# Patient Record
Sex: Male | Born: 1975 | Race: White | Hispanic: No | Smoking: Never smoker
Health system: Southern US, Community
[De-identification: ages and names within clinical notes are randomized; demographics above are authoritative.]

## PROBLEM LIST (undated history)

## (undated) DIAGNOSIS — G8929 Other chronic pain: Secondary | ICD-10-CM

## (undated) DIAGNOSIS — H539 Unspecified visual disturbance: Secondary | ICD-10-CM

## (undated) DIAGNOSIS — R519 Headache, unspecified: Secondary | ICD-10-CM

## (undated) DIAGNOSIS — R51 Headache: Secondary | ICD-10-CM

## (undated) HISTORY — DX: Headache, unspecified: R51.9

## (undated) HISTORY — DX: Headache: R51

## (undated) HISTORY — PX: TONSILLECTOMY: SUR1361

## (undated) HISTORY — DX: Other chronic pain: G89.29

## (undated) HISTORY — DX: Unspecified visual disturbance: H53.9

---

## 2013-11-05 ENCOUNTER — Emergency Department (HOSPITAL_COMMUNITY): Payer: Commercial Indemnity

## 2013-11-05 ENCOUNTER — Encounter (HOSPITAL_COMMUNITY): Payer: Self-pay | Admitting: Emergency Medicine

## 2013-11-05 ENCOUNTER — Emergency Department (HOSPITAL_COMMUNITY)
Admission: EM | Admit: 2013-11-05 | Discharge: 2013-11-05 | Disposition: A | Discharge status: Home / Self Care | Payer: Commercial Indemnity | Attending: Emergency Medicine | Admitting: Emergency Medicine

## 2013-11-05 DIAGNOSIS — Y92410 Unspecified street and highway as the place of occurrence of the external cause: Secondary | ICD-10-CM | POA: Insufficient documentation

## 2013-11-05 DIAGNOSIS — Y9389 Activity, other specified: Secondary | ICD-10-CM | POA: Diagnosis not present

## 2013-11-05 DIAGNOSIS — S51809A Unspecified open wound of unspecified forearm, initial encounter: Secondary | ICD-10-CM | POA: Diagnosis present

## 2013-11-05 DIAGNOSIS — Z23 Encounter for immunization: Secondary | ICD-10-CM | POA: Diagnosis not present

## 2013-11-05 DIAGNOSIS — IMO0002 Reserved for concepts with insufficient information to code with codable children: Secondary | ICD-10-CM | POA: Insufficient documentation

## 2013-11-05 DIAGNOSIS — S41111A Laceration without foreign body of right upper arm, initial encounter: Secondary | ICD-10-CM

## 2013-11-05 MED ORDER — TETANUS-DIPHTH-ACELL PERTUSSIS 5-2.5-18.5 LF-MCG/0.5 IM SUSP
0.5000 mL | Freq: Once | INTRAMUSCULAR | Status: AC
  Administered 2013-11-05: 0.5 mL via INTRAMUSCULAR
  Filled 2013-11-05: qty 0.5

## 2013-11-05 MED ORDER — HYDROCODONE-ACETAMINOPHEN 5-325 MG PO TABS: 1.0000 | ORAL_TABLET | ORAL | Status: DC | PRN

## 2013-11-05 MED ORDER — IBUPROFEN 800 MG PO TABS: 800.0000 mg | ORAL_TABLET | Freq: Three times a day (TID) | ORAL | Status: DC

## 2013-11-05 MED ORDER — CYCLOBENZAPRINE HCL 10 MG PO TABS: 10.0000 mg | ORAL_TABLET | Freq: Two times a day (BID) | ORAL | Status: DC | PRN
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
## 2013-11-05 NOTE — Discharge Instructions (Signed)
Tetanus and Diphtheria Vaccine Your caregiver has suggested that you receive an immunization to prevent tetanus (lockjaw) and diphtheria. Tetanus and diphtheria are serious and deadly infectious diseases of the past that have been nearly wiped out by modern immunizations. Td or DT vaccines (shots) are the immunizations given to help prevent these illnesses. Td is the medical term for a standard tetanus dose, small diphtheria dose. DT means both in standard doses. ABOUT THE DISEASES Tetanus (lockjaw) and diphtheria are serious diseases. Tetanus is caused by a germ that lives in the soil. It enters the body through a cut or wound, often caused by a nail or broken piece of glass. You cannot catch tetanus from another person. Diphtheria spreads when germs pass from an infected person to the nose or throat of others. Tetanus causes serious, painful spasms of all muscles. It can lead to:  "Locking" of the muscles of the jaw and throat, so the patient cannot open his or her mouth or swallow.  Damage to the heart muscle. Diphtheria causes a thick coating in the nose, throat, or airway. It can lead to:  Breathing problems.  Kidney problems.  Heart failure.  Paralysis.  Death. ABOUT THE VACCINES  A vaccine is a shot (immunization) that can help prevent a disease. Vaccines have helped lower the rates of getting certain diseases. If people stopped getting vaccinated, more people would develop illnesses. These vaccines can be used in three ways:  As catch-up for people who did not get all their doses when they were children.  As a booster dose every 10 years.  For protection against tetanus infection, after a wound. Benefits of the vaccines Vaccination is the best way to protect against tetanus and diphtheria. Because of vaccination, there are fewer cases of these diseases. Cases are rare in children because most get a routine vaccination with DTP (Diphtheria, Tetanus, and Pertussis), DTaP  (Diphtheria, Tetanus, and acellular Pertussis), or DT (Diphtheria and Tetanus) vaccines. There would be many more cases if we stopped vaccinating people. Tetanus kills about 1 in 5 people who are infected. WHEN SHOULD YOU GET TD VACCINE?  Td is made for people 62 years of age and older.  People who have not gotten at least 3 doses of any tetanus and diphtheria vaccine (DTP, DTaP or DT) during their lifetime should do so using Td. After a person gets the third dose, a Td dose is needed every 10 years all through life. This is because protection fades over time. Booster shots are needed every 10 years.  Other vaccines may be given at the same time as Td. You may not know today whether your immunizations are current. The vaccine given today is to protect you from your next cut or injury. It does not offer protection for the current injury. An immune globulin injection may be given, if protection is needed immediately. Check with your caregiver later regarding your immunization status. Tell your caregiver if the person getting the vaccine:  Has ever had a serious allergic reaction or other problem with Td, or any other tetanus and diphtheria vaccine (DTP, DTaP, or DT). People who have had a serious allergic reaction should not receive the vaccine.  Has epilepsy or another nervous system illness.  Has had Guillain Barre Syndrome (GBS) in the past.  Now has a moderate or severe illness.  Is pregnant.  If you are not sure, ask your caregiver. WHAT ARE THE RISKS FROM TD VACCINE?  As with any medicine, there are very small  risks that serious problems, even death, could occur after getting a vaccine. However, the risk of a serious side effect from the vaccine is almost zero. °· The risks from the vaccine are much smaller than the risks from the diseases, if people stopped getting vaccinated. Both diseases can cause serious health problems, which are prevented by the vaccine. °· Almost all people who get  Td have no problems from it. °Mild problems °If mild problems occur, they usually start within hours to a day or two after vaccination. They may last 1-2 days: °· Soreness, redness, or swelling where the shot was given. °· Headache or tiredness. °· Occasionally, a low grade fever. °These problems can be worse in adults who get Td vaccine very often. Non-aspirin medicines may be used to reduce soreness. °Severe problems °These problems happen very rarely: °· Serious allergic reaction (at most, occurs in 1 in 1 million vaccinated persons). This occurs almost immediately, and is treatable with medicines. Signs of a serious allergic reaction include: °· Difficulty breathing. °· Hoarseness or wheezing. °· Hives. °· Dizziness. °· Deep, aching pain and muscle wasting in upper arm(s). °Overall, the benefits to you and your family from these vaccines are far greater than the risk. °WHAT TO DO IF THERE IS A SERIOUS REACTION: °· Call a caregiver or get the person to a doctor or emergency room right away. °· Write down what happened, the date and time it happened, and tell your caregiver. °· Ask your caregiver to file a Vaccine Adverse Event Report form or call, toll-free: [phone] °If you want to learn more about this vaccine, ask your caregiver. She/he can give you the vaccine package insert or suggest other sources of information. Also, the National Vaccine Injury Compensation Program gives compensation (payment) for persons thought to be injured by vaccines. For details call, toll-free: (800) 338-2382. °Document Released: 05/13/2000 Document Revised: 08/08/2011 Document Reviewed: 04/02/2009 °[employer]® Patient Information ©2014 [employer]. °Sutured Wound Care °Sutures are stitches that can be used to close wounds. Wound care helps prevent pain and infection.  °HOME CARE INSTRUCTIONS  °· Rest and elevate the injured area until all the pain and swelling are gone. °· Only take over-the-counter or prescription medicines  for pain, discomfort, or fever as directed by your caregiver. °· After 48 hours, gently wash the area with mild soap and water once a day, or as directed. Rinse off the soap. Pat the area dry with a clean towel. Do not rub the wound. This may cause bleeding. °· Follow your caregiver's instructions for how often to change the bandage (dressing). Stop using a dressing after 2 days or after the wound stops draining. °· If the dressing sticks, moisten it with soapy water and gently remove it. °· Apply ointment on the wound as directed. °· Avoid stretching a sutured wound. °· Drink enough fluids to keep your urine clear or pale yellow. °· Follow up with your caregiver for suture removal as directed. °· Use sunscreen on your wound for the next 3 to 6 months so the scar will not darken. °SEEK IMMEDIATE MEDICAL CARE IF:  °· Your wound becomes red, swollen, hot, or tender. °· You have increasing pain in the wound. °· You have a red streak that extends from the wound. °· There is pus coming from the wound. °· You have a fever. °· You have shaking chills. °· There is a bad smell coming from the wound. °· You have persistent bleeding from the wound. °MAKE SURE YOU:  °·   Understand these instructions.  Will watch your condition.  Will get help right away if you are not doing well or get worse. Document Released: 06/23/2004 Document Revised: 08/08/2011 Document Reviewed: 09/19/2010 [facility] Patient Information 2014 [place], [place]. Cryotherapy Cryotherapy means treatment with cold. Ice or gel packs can be used to reduce both pain and swelling. Ice is the most helpful within the first 24 to 48 hours after an injury or flareup from overusing a muscle or joint. Sprains, strains, spasms, burning pain, shooting pain, and aches can all be eased with ice. Ice can also be used when recovering from surgery. Ice is effective, has very few side effects, and is safe for most people to use. PRECAUTIONS  Ice is not a safe treatment  option for people with:  Raynaud's phenomenon. This is a condition affecting small blood vessels in the extremities. Exposure to cold may cause your problems to return.  Cold hypersensitivity. There are many forms of cold hypersensitivity, including:  Cold urticaria. Red, itchy hives appear on the skin when the tissues begin to warm after being iced.  Cold erythema. This is a red, itchy rash caused by exposure to cold.  Cold hemoglobinuria. Red blood cells break down when the tissues begin to warm after being iced. The hemoglobin that carry oxygen are passed into the urine because they cannot combine with blood proteins fast enough.  Numbness or altered sensitivity in the area being iced. If you have any of the following conditions, do not use ice until you have discussed cryotherapy with your caregiver:  Heart conditions, such as arrhythmia, angina, or chronic heart disease.  High blood pressure.  Healing wounds or open skin in the area being iced.  Current infections.  Rheumatoid arthritis.  Poor circulation.  Diabetes. Ice slows the blood flow in the region it is applied. This is beneficial when trying to stop inflamed tissues from spreading irritating chemicals to surrounding tissues. However, if you expose your skin to cold temperatures for too long or without the proper protection, you can damage your skin or nerves. Watch for signs of skin damage due to cold. HOME CARE INSTRUCTIONS Follow these tips to use ice and cold packs safely.  Place a dry or damp towel between the ice and skin. A damp towel will cool the skin more quickly, so you may need to shorten the time that the ice is used.  For a more rapid response, add gentle compression to the ice.  Ice for no more than 10 to 20 minutes at a time. The bonier the area you are icing, the less time it will take to get the benefits of ice.  Check your skin after 5 minutes to make sure there are no signs of a poor response to  cold or skin damage.  Rest 20 minutes or more in between uses.  Once your skin is numb, you can end your treatment. You can test numbness by very lightly touching your skin. The touch should be so light that you do not see the skin dimple from the pressure of your fingertip. When using ice, most people will feel these normal sensations in this order: cold, burning, aching, and numbness.  Do not use ice on someone who cannot communicate their responses to pain, such as small children or people with dementia. HOW TO MAKE AN ICE PACK Ice packs are the most common way to use ice therapy. Other methods include ice massage, ice baths, and cryo-sprays. Muscle creams that cause a cold,  tingly feeling do not offer the same benefits that ice offers and should not be used as a substitute unless recommended by your caregiver. To make an ice pack, do one of the following:  Place crushed ice or a bag of frozen vegetables in a sealable plastic bag. Squeeze out the excess air. Place this bag inside another plastic bag. Slide the bag into a pillowcase or place a damp towel between your skin and the bag.  Mix 3 parts water with 1 part rubbing alcohol. Freeze the mixture in a sealable plastic bag. When you remove the mixture from the freezer, it will be slushy. Squeeze out the excess air. Place this bag inside another plastic bag. Slide the bag into a pillowcase or place a damp towel between your skin and the bag. SEEK MEDICAL CARE IF:  You develop white spots on your skin. This may give the skin a blotchy (mottled) appearance.  Your skin turns blue or pale.  Your skin becomes waxy or hard.  Your swelling gets worse. MAKE SURE YOU:   Understand these instructions.  Will watch your condition.  Will get help right away if you are not doing well or get worse. Document Released: 01/10/2011 Document Revised: 08/08/2011 Document Reviewed: 01/10/2011 [facility] Patient Information 2014 [employer] [place]. Motor  Vehicle Collision  It is common to have multiple bruises and sore muscles after a motor vehicle collision (MVC). These tend to feel worse for the first 24 hours. You may have the most stiffness and soreness over the first several hours. You may also feel worse when you wake up the first morning after your collision. After this point, you will usually begin to improve with each day. The speed of improvement often depends on the severity of the collision, the number of injuries, and the location and nature of these injuries. HOME CARE INSTRUCTIONS   Put ice on the injured area.  Put ice in a plastic bag.  Place a towel between your skin and the bag.  Leave the ice on for 15-20 minutes, 03-04 times a day.  Drink enough fluids to keep your urine clear or pale yellow. Do not drink alcohol.  Take a warm shower or bath once or twice a day. This will increase blood flow to sore muscles.  You may return to activities as directed by your caregiver. Be careful when lifting, as this may aggravate neck or back pain.  Only take over-the-counter or prescription medicines for pain, discomfort, or fever as directed by your caregiver. Do not use aspirin. This may increase bruising and bleeding. SEEK IMMEDIATE MEDICAL CARE IF:  You have numbness, tingling, or weakness in the arms or legs.  You develop severe headaches not relieved with medicine.  You have severe neck pain, especially tenderness in the middle of the back of your neck.  You have changes in bowel or bladder control.  There is increasing pain in any area of the body.  You have shortness of breath, lightheadedness, dizziness, or fainting.  You have chest pain.  You feel sick to your stomach (nauseous), throw up (vomit), or sweat.  You have increasing abdominal discomfort.  There is blood in your urine, stool, or vomit.  You have pain in your shoulder (shoulder strap areas).  You feel your symptoms are getting worse. MAKE SURE  YOU:   Understand these instructions.  Will watch your condition.  Will get help right away if you are not doing well or get worse. Document Released: 05/16/2005 Document Revised:  08/08/2011 Document Reviewed: 10/13/2010 [employer] Patient Information 2014 [place].

## 2013-11-06 NOTE — ED Provider Notes (Signed)
Medical screening examination/treatment/procedure(s) were performed by non-physician practitioner and as supervising physician I was immediately available for consultation/collaboration.   EKG Interpretation None        Brendi Mccarroll T Denea Cheaney, MD 11/06/13 1541 

## 2013-11-14 ENCOUNTER — Encounter: Payer: Self-pay | Admitting: Emergency Medicine

## 2013-11-14 ENCOUNTER — Emergency Department
Admission: EM | Admit: 2013-11-14 | Discharge: 2013-11-14 | Disposition: A | Discharge status: Home / Self Care | Payer: Managed Care, Other (non HMO) | Source: Home / Self Care

## 2013-11-14 NOTE — ED Notes (Signed)
Removed 4 sutures from right forearm healing well, tolerated well, no signs of infection, cleaned dressed with bacitracin and a bandaid.

## 2014-04-11 ENCOUNTER — Ambulatory Visit (INDEPENDENT_AMBULATORY_CARE_PROVIDER_SITE_OTHER): Payer: Managed Care, Other (non HMO) | Admitting: Family Medicine

## 2014-04-11 ENCOUNTER — Encounter: Payer: Self-pay | Admitting: Family Medicine

## 2014-04-11 VITALS — BP 130/76 | HR 63 | Ht 71.0 in | Wt 208.0 lb

## 2014-04-11 DIAGNOSIS — Z0000 Encounter for general adult medical examination without abnormal findings: Secondary | ICD-10-CM

## 2014-04-11 LAB — CBC
HCT: 45.2 % (ref 39.0–52.0)
Hemoglobin: 15 g/dL (ref 13.0–17.0)
MCH: 29.6 pg (ref 26.0–34.0)
MCHC: 33.2 g/dL (ref 30.0–36.0)
MCV: 89.2 fL (ref 78.0–100.0)
PLATELETS: 289 10*3/uL (ref 150–400)
RBC: 5.07 MIL/uL (ref 4.22–5.81)
RDW: 13.2 % (ref 11.5–15.5)
WBC: 5.5 10*3/uL (ref 4.0–10.5)

## 2014-04-11 LAB — COMPLETE METABOLIC PANEL WITH GFR
ALBUMIN: 4.5 g/dL (ref 3.5–5.2)
ALT: 39 U/L (ref 0–53)
AST: 36 U/L (ref 0–37)
Alkaline Phosphatase: 84 U/L (ref 39–117)
BUN: 22 mg/dL (ref 6–23)
CALCIUM: 9.5 mg/dL (ref 8.4–10.5)
CHLORIDE: 103 meq/L (ref 96–112)
CO2: 25 mEq/L (ref 19–32)
Creat: 1.13 mg/dL (ref 0.50–1.35)
GFR, Est African American: >89 mL/min
GFR, Est Non African American: 81 mL/min
Glucose, Bld: 86 mg/dL (ref 70–99)
POTASSIUM: 4.3 meq/L (ref 3.5–5.3)
Sodium: 141 mEq/L (ref 135–145)
Total Bilirubin: 0.6 mg/dL (ref 0.2–1.2)
Total Protein: 7.1 g/dL (ref 6.0–8.3)

## 2014-04-11 LAB — LIPID PANEL
CHOLESTEROL: 182 mg/dL (ref 0–200)
HDL: 46 mg/dL (ref >39)
LDL Cholesterol: 122 mg/dL — ABNORMAL HIGH (ref 0–99)
Total CHOL/HDL Ratio: 4 Ratio
Triglycerides: 70 mg/dL (ref <150)
VLDL: 14 mg/dL (ref 0–40)

## 2014-04-11 NOTE — Progress Notes (Signed)
CC: Brian Rasmussen is a 38 y.o. male is here for No chief complaint on file.   Subjective: HPI:  Colonoscopy: , will begin screening at age 40. Prostate: Discussed screening risks/beneifts with patient during today's visit, we'll begin screening at age 40  Influenza Vaccine: declined Pneumovax: no current indication Td/Tdap: UTD as of 2015 Zoster: (Start 40 yo)  Presents for complete physical exam with no acute complaints. He has a biometric form to be filled out for [facility]   Rare alcohol use no tobacco or recreational drug use  Review of Systems - General ROS: negative for - chills, fever, night sweats, weight gain or weight loss Ophthalmic ROS: negative for - decreased vision Psychological ROS: negative for - anxiety or depression ENT ROS: negative for - hearing change, nasal congestion, tinnitus or allergies Hematological and Lymphatic ROS: negative for - bleeding problems, bruising or swollen lymph nodes Breast ROS: negative Respiratory ROS: no cough, shortness of breath, or wheezing Cardiovascular ROS: no chest pain or dyspnea on exertion Gastrointestinal ROS: no abdominal pain, change in bowel habits, or black or bloody stools Genito-Urinary ROS: negative for - genital discharge, genital ulcers, incontinence or abnormal bleeding from genitals Musculoskeletal ROS: negative for - joint pain or muscle pain Neurological ROS: negative for - headaches or memory loss Dermatological ROS: negative for lumps, mole changes, rash and skin lesion changes  History reviewed. No pertinent past medical history.  Past Surgical History  Procedure Laterality Date  . Tonsillectomy     Family History  Problem Relation Age of Onset  . Hyperlipidemia    . Hypertension      History   Social History  . Marital Status: Single    Spouse Name: N/A    Number of Children: N/A  . Years of Education: N/A   Occupational History  . Not on file.   Social History Main Topics  . Smoking  status: Never Smoker   . Smokeless tobacco: Not on file  . Alcohol Use: Not on file  . Drug Use: 4.00 per week  . Sexual Activity:    Partners: Male   Other Topics Concern  . Not on file   Social History Narrative     Objective: BP 130/76 mmHg  Pulse 63  Ht 5\' 11"  (1.803 m)  Wt 208 lb (94.348 kg)  BMI 29.02 kg/m2  General: No Acute Distress HEENT: Atraumatic, normocephalic, conjunctivae normal without scleral icterus.  No nasal discharge, hearing grossly intact, TMs with good landmarks bilaterally with no middle ear abnormalities, posterior pharynx clear without oral lesions. Neck: Supple, trachea midline, no cervical nor supraclavicular adenopathy. Pulmonary: Clear to auscultation bilaterally without wheezing, rhonchi, nor rales. Cardiac: Regular rate and rhythm.  No murmurs, rubs, nor gallops. No peripheral edema.  2+ peripheral pulses bilaterally. Abdomen: Bowel sounds normal.  No masses.  Non-tender without rebound.  Negative Murphy's sign. MSK: Grossly intact, no signs of weakness.  Full strength throughout upper and lower extremities.  Full ROM in upper and lower extremities.  No midline spinal tenderness. Neuro: Gait unremarkable, CN II-XII grossly intact.  C5-C6 Reflex 2/4 Bilaterally, L4 Reflex 2/4 Bilaterally.  Cerebellar function intact. Skin: No rashes. Psych: Alert and oriented to person/place/time.  Thought process normal. No anxiety/depression.  Assessment & Plan: Brian Rasmussen was seen today for no specified reason.  Diagnoses and associated orders for this visit:  Annual physical exam - Lipid panel - COMPLETE METABOLIC PANEL WITH GFR - CBC    Healthy lifestyle interventions including but not limited to  regular exercise, a healthy low fat diet, moderation of salt intake, the dangers of tobacco/alcohol/recreational drug use, nutrition supplementation, and accident avoidance were discussed with the patient and a handout was provided for future reference.  Return  if symptoms worsen or fail to improve.

## 2014-04-14 ENCOUNTER — Encounter: Payer: Self-pay | Admitting: Family Medicine

## 2014-06-06 ENCOUNTER — Ambulatory Visit: Payer: Managed Care, Other (non HMO) | Admitting: Family Medicine

## 2014-06-10 ENCOUNTER — Encounter: Payer: Self-pay | Admitting: Family Medicine

## 2014-06-10 ENCOUNTER — Ambulatory Visit (INDEPENDENT_AMBULATORY_CARE_PROVIDER_SITE_OTHER): Payer: Managed Care, Other (non HMO) | Admitting: Family Medicine

## 2014-06-10 VITALS — BP 143/77 | HR 74 | Wt 213.0 lb

## 2014-06-10 DIAGNOSIS — B070 Plantar wart: Secondary | ICD-10-CM

## 2014-06-10 NOTE — Progress Notes (Signed)
CC: Brian Rasmussen is a 39 y.o. male is here for plantar wart? left foot   Subjective: HPI:  Pain and growth localized on the plantar surface of the left foot further localized laterally which has been present for the last 2-4 weeks on a daily basis. It was initially improved with taking some skin off of it earlier last week however has enlarged in return back to a moderate degree of pain. It is painless when nonweightbearing and only present when he is applying pressure to the sole of his foot. He's never had this before. No other interventions other than that described above. He denies skin changes elsewhere. He denies any joint or skin pain elsewhere. Denies fevers, chills, swollen lymph nodes   Review Of Systems Outlined In HPI  No past medical history on file.  Past Surgical History  Procedure Laterality Date  . Tonsillectomy     Family History  Problem Relation Age of Onset  . Hyperlipidemia    . Hypertension      History   Social History  . Marital Status: Married    Spouse Name: N/A    Number of Children: N/A  . Years of Education: N/A   Occupational History  . Not on file.   Social History Main Topics  . Smoking status: Never Smoker   . Smokeless tobacco: Not on file  . Alcohol Use: Not on file  . Drug Use: 4.00 per week  . Sexual Activity:    Partners: Male   Other Topics Concern  . Not on file   Social History Narrative     Objective: BP 143/77 mmHg  Pulse 74  Wt 213 lb (96.616 kg)  Vital signs reviewed. General: Alert and Oriented, No Acute Distress HEENT: Pupils equal, round, reactive to light. Conjunctivae clear.  External ears unremarkable.  Moist mucous membranes. Lungs: Clear and comfortable work of breathing, speaking in full sentences without accessory muscle use. Cardiac: Regular rate and rhythm.  Neuro: CN II-XII grossly intact, gait normal. Extremities: No peripheral edema.  Strong peripheral pulses.  Mental Status: No depression,  anxiety, nor agitation. Logical though process. Skin: Warm and dry. Plantar wart on the left lateral sole of the foot.  Assessment & Plan: Brian Rasmussen was seen today for plantar wart? left foot.  Diagnoses and associated orders for this visit:  Plantar wart, left foot    After cleaning the bottom of the foot with alcoholic scalpel was used to compare down the callus overlying the root of the plantar wart. He was offered cryotherapy versus cantharidin. He preferred cryotherapy. I asked him to return in 2 weeks if a blister and scab has not formed and then fall off on its own.  Return if symptoms worsen or fail to improve.   Cryotherapy Procedure Note  Pre-operative Diagnosis: Painful plantar wart  Post-operative Diagnosis: Same  Locations: Plantar aspect of left foot  Indications: Pain  Anesthesia: None  Procedure Details  History of allergy to iodine: no. Pacemaker? no.  Patient informed of risks (permanent scarring, infection, light or dark discoloration, bleeding, infection, weakness, numbness and recurrence of the lesion) and benefits of the procedure and verbal informed consent obtained.  The areas are treated with liquid nitrogen therapy, frozen until ice ball extended 2 mm beyond lesion, allowed to thaw, and treated again. The patient tolerated procedure well.  The patient was instructed on post-op care, warned that there may be blister formation, redness and pain. Recommend OTC analgesia as needed for pain.  Condition:  Stable  Complications: none.  Plan: 1. Instructed to keep the area dry and covered for 24-48h and clean thereafter. 2. Warning signs of infection were reviewed.   3. Recommended that the patient use OTC analgesics as needed for pain.  4. Return PRN

## 2014-11-03 ENCOUNTER — Ambulatory Visit (INDEPENDENT_AMBULATORY_CARE_PROVIDER_SITE_OTHER): Payer: Managed Care, Other (non HMO) | Admitting: Family Medicine

## 2014-11-03 ENCOUNTER — Encounter: Payer: Self-pay | Admitting: Family Medicine

## 2014-11-03 VITALS — BP 124/75 | HR 59 | Wt 204.0 lb

## 2014-11-03 DIAGNOSIS — B070 Plantar wart: Secondary | ICD-10-CM

## 2014-11-03 NOTE — Progress Notes (Signed)
CC: Brian Rasmussen is a 39 y.o. male is here for Plantar Warts   Subjective: HPI:  Left foot pain localized on the plantar surface of the foot, lateral midfoot region. Present for the last 1 or 2 weeks. Seems to be more noticeable on a daily basis currently moderate in severity but only present when weightbearing. Described as sharp, nonradiating. He believes a callous is forming over this discomfort. Denies any skin changes elsewhere. Denies any joint pain or pain in general elsewhere. No interventions as of yet. no fevers, chills,nor swollen lymph nodes.   Review Of Systems Outlined In HPI  No past medical history on file.  Past Surgical History  Procedure Laterality Date  . Tonsillectomy     Family History  Problem Relation Age of Onset  . Hyperlipidemia    . Hypertension      History   Social History  . Marital Status: Married    Spouse Name: N/A  . Number of Children: N/A  . Years of Education: N/A   Occupational History  . Not on file.   Social History Main Topics  . Smoking status: Never Smoker   . Smokeless tobacco: Not on file  . Alcohol Use: Not on file  . Drug Use: 4.00 per week  . Sexual Activity:    Partners: Male   Other Topics Concern  . Not on file   Social History Narrative     Objective: BP 124/75 mmHg  Pulse 59  Wt 204 lb (92.534 kg)  Vital signs reviewed. General: Alert and Oriented, No Acute Distress HEENT: Pupils equal, round, reactive to light. Conjunctivae clear.  External ears unremarkable.  Moist mucous membranes. Lungs: Clear and comfortable work of breathing, speaking in full sentences without accessory muscle use. Cardiac: Regular rate and rhythm.  Neuro: CN II-XII grossly intact, gait normal. Extremities: No peripheral edema.  Strong peripheral pulses.  Mental Status: No depression, anxiety, nor agitation. Logical though process. Skin: Warm and dry. Tender plantar wart on the lateral plantar midfoot on the  left   Assessment & Plan: Brian Rasmussen was seen today for plantar warts.  Diagnoses and all orders for this visit:  Plantar wart   Time was taken to shave the callus overlying the wart with a scalpel after cleaning this region with alcohol. 3 cycles of cryotherapy were applied freezing for enough time to allow a ice ball to extend 3 mm beyond the site of the wart. Enough time was allowed to fall the freeze ball between each cycle.  If for some reason this does not work I've asked him to call me and I will fit him in any day of the week that I'm in the office that also works for him.  I discussed with him that this is not routine so if whoever scheduling him questions this he noticed to direct them to this office note.  Return in about 4 weeks (around 12/01/2014).

## 2015-09-08 ENCOUNTER — Ambulatory Visit (INDEPENDENT_AMBULATORY_CARE_PROVIDER_SITE_OTHER): Payer: Managed Care, Other (non HMO) | Admitting: Family Medicine

## 2015-09-08 ENCOUNTER — Encounter: Payer: Self-pay | Admitting: Family Medicine

## 2015-09-08 VITALS — BP 143/91 | HR 70 | Temp 98.1°F | Wt 200.0 lb

## 2015-09-08 DIAGNOSIS — R0982 Postnasal drip: Secondary | ICD-10-CM

## 2015-09-08 MED ORDER — AMOXICILLIN-POT CLAVULANATE 500-125 MG PO TABS: ORAL_TABLET | ORAL | Status: AC

## 2015-09-08 NOTE — Progress Notes (Signed)
CC: Brian Rasmussen is a 40 y.o. male is here for Sinusitis   Subjective: HPI:  Postnasal drip, sore throat, nonproductive cough present for the last 8 days worsening on a daily basis and no benefit from Xyzal, Zyrtec, Flonase. Symptoms are worse in the evening and first thing in the morning. Denies fevers, chest pain, shortness of breath, wheezing or difficulty swallowing. No rash or headache   Review Of Systems Outlined In HPI  No past medical history on file.  Past Surgical History  Procedure Laterality Date  . Tonsillectomy     Family History  Problem Relation Age of Onset  . Hyperlipidemia    . Hypertension      Social History   Social History  . Marital Status: Married    Spouse Name: N/A  . Number of Children: N/A  . Years of Education: N/A   Occupational History  . Not on file.   Social History Main Topics  . Smoking status: Never Smoker   . Smokeless tobacco: Not on file  . Alcohol Use: Not on file  . Drug Use: 4.00 per week  . Sexual Activity:    Partners: Male   Other Topics Concern  . Not on file   Social History Narrative     Objective: BP 143/91 mmHg  Pulse 70  Temp(Src) 98.1 F (36.7 C) (Oral)  Wt 200 lb (90.719 kg)  General: Alert and Oriented, No Acute Distress HEENT: Pupils equal, round, reactive to light. Conjunctivae clear.  External ears unremarkable, canals clear with intact TMs with appropriate landmarks.  Middle ear appears open without effusion. Pink inferior turbinates.  Moist mucous membranes, pharynxWithout lesions however moderate inflammation, uvula is midline..  Neck supple without palpable lymphadenopathy nor abnormal masses. Lungs: Clear to auscultation bilaterally, no wheezing/ronchi/rales.  Comfortable work of breathing. Good air movement. Extremities: No peripheral edema.  Strong peripheral pulses.  Mental Status: No depression, anxiety, nor agitation. Skin: Warm and dry.  Assessment & Plan: Christiane HaJonathan was seen today for  sinusitis.  Diagnoses and all orders for this visit:  Postnasal drip -     amoxicillin-clavulanate (AUGMENTIN) 500-125 MG tablet; Take one by mouth every 8 hours for ten total days.   Start Augmentin for suspected sinusitis consider nasal saline washes.  Return if symptoms worsen or fail to improve.

## 2015-12-09 ENCOUNTER — Ambulatory Visit (INDEPENDENT_AMBULATORY_CARE_PROVIDER_SITE_OTHER): Payer: Managed Care, Other (non HMO)

## 2015-12-09 ENCOUNTER — Ambulatory Visit (INDEPENDENT_AMBULATORY_CARE_PROVIDER_SITE_OTHER): Payer: Managed Care, Other (non HMO) | Admitting: Family Medicine

## 2015-12-09 ENCOUNTER — Encounter: Payer: Self-pay | Admitting: Family Medicine

## 2015-12-09 VITALS — BP 137/80 | HR 70 | Wt 204.0 lb

## 2015-12-09 DIAGNOSIS — R1032 Left lower quadrant pain: Secondary | ICD-10-CM

## 2015-12-09 DIAGNOSIS — K5649 Other impaction of intestine: Secondary | ICD-10-CM

## 2015-12-09 MED ORDER — SENNOSIDES-DOCUSATE SODIUM 8.6-50 MG PO TABS: 2.0000 | ORAL_TABLET | Freq: Every day | ORAL | Status: DC

## 2015-12-09 NOTE — Progress Notes (Signed)
CC: Brian Rasmussen is a 40 y.o. male is here for Abdominal Cramping   Subjective: HPI:  Left lower quadrant pain present for the last 3 days. It comes and goes throughout the day. Feels like a stretching sensation. It is nonradiating and mild in severity. He is not certain if it gets better or worse after a bowel movement. He denies any constipation or diarrhea. He tells me it almost feels like it's just gas. No interventions as of yet. No new change in diet or medications. No interventions as of yet. Denies any fevers, chills, genitourinary complaints or decreased appetite. No nausea, he's never had this before   Review Of Systems Outlined In HPI  No past medical history on file.  Past Surgical History  Procedure Laterality Date  . Tonsillectomy     Family History  Problem Relation Age of Onset  . Hyperlipidemia    . Hypertension      Social History   Social History  . Marital Status: Married    Spouse Name: N/A  . Number of Children: N/A  . Years of Education: N/A   Occupational History  . Not on file.   Social History Main Topics  . Smoking status: Never Smoker   . Smokeless tobacco: Not on file  . Alcohol Use: Not on file  . Drug Use: 4.00 per week  . Sexual Activity:    Partners: Male   Other Topics Concern  . Not on file   Social History Narrative     Objective: BP 137/80 mmHg  Pulse 70  Wt 204 lb (92.534 kg)  General: Alert and Oriented, No Acute Distress HEENT: Pupils equal, round, reactive to light. Conjunctivae clear.  Moist mucous membranes Lungs: Clear to auscultation bilaterally, no wheezing/ronchi/rales.  Comfortable work of breathing. Good air movement. Cardiac: Regular rate and rhythm. Normal S1/S2.  No murmurs, rubs, nor gallops.   Abdomen: Normal bowel sounds, soft and non tender without palpable masses. No rebound tenderness or rigidity. Extremities: No peripheral edema.  Strong peripheral pulses.  Mental Status: No depression, anxiety,  nor agitation. Skin: Warm and dry.  Assessment & Plan: Brian Rasmussen was seen today for abdominal cramping.  Diagnoses and all orders for this visit:  LLQ pain -     DG Abd 2 Views  Other orders -     senna-docusate (SENOKOT S) 8.6-50 MG tablet; Take 2 tablets by mouth at bedtime.   X-ray was obtained showing a moderate amount of stool in the colon, no other abnormality to account for his abdominal pain. I like to try Senokot for 3 days and if no better we'll get CT scan and blood work.  Return if symptoms worsen or fail to improve.

## 2017-06-23 ENCOUNTER — Encounter: Payer: Self-pay | Admitting: Physician Assistant

## 2017-06-23 ENCOUNTER — Ambulatory Visit (INDEPENDENT_AMBULATORY_CARE_PROVIDER_SITE_OTHER): Payer: Managed Care, Other (non HMO) | Admitting: Physician Assistant

## 2017-06-23 VITALS — BP 135/79 | HR 66 | Ht 71.0 in | Wt 210.0 lb

## 2017-06-23 DIAGNOSIS — Z7689 Persons encountering health services in other specified circumstances: Secondary | ICD-10-CM

## 2017-06-23 DIAGNOSIS — Z130 Encounter for screening for diseases of the blood and blood-forming organs and certain disorders involving the immune mechanism: Secondary | ICD-10-CM | POA: Diagnosis not present

## 2017-06-23 DIAGNOSIS — R030 Elevated blood-pressure reading, without diagnosis of hypertension: Secondary | ICD-10-CM

## 2017-06-23 DIAGNOSIS — Z131 Encounter for screening for diabetes mellitus: Secondary | ICD-10-CM | POA: Diagnosis not present

## 2017-06-23 DIAGNOSIS — M7741 Metatarsalgia, right foot: Secondary | ICD-10-CM

## 2017-06-23 DIAGNOSIS — Z13220 Encounter for screening for lipoid disorders: Secondary | ICD-10-CM

## 2017-06-23 DIAGNOSIS — E663 Overweight: Secondary | ICD-10-CM | POA: Diagnosis not present

## 2017-06-23 DIAGNOSIS — Z0189 Encounter for other specified special examinations: Secondary | ICD-10-CM

## 2017-06-23 DIAGNOSIS — Z008 Encounter for other general examination: Secondary | ICD-10-CM

## 2017-06-23 DIAGNOSIS — M21611 Bunion of right foot: Secondary | ICD-10-CM | POA: Insufficient documentation

## 2017-06-23 LAB — LIPID PANEL W/REFLEX DIRECT LDL
Cholesterol: 193 mg/dL (ref <200)
HDL: 42 mg/dL (ref >40)
LDL Cholesterol (Calc): 131 mg/dL (calc) — ABNORMAL HIGH
NON-HDL CHOLESTEROL (CALC): 151 mg/dL — AB (ref <130)
TRIGLYCERIDES: 97 mg/dL (ref <150)
Total CHOL/HDL Ratio: 4.6 (calc) (ref <5.0)

## 2017-06-23 LAB — COMPREHENSIVE METABOLIC PANEL
AG RATIO: 1.8 (calc) (ref 1.0–2.5)
ALT: 33 U/L (ref 9–46)
AST: 36 U/L (ref 10–40)
Albumin: 4.6 g/dL (ref 3.6–5.1)
Alkaline phosphatase (APISO): 51 U/L (ref 40–115)
BUN: 18 mg/dL (ref 7–25)
CHLORIDE: 105 mmol/L (ref 98–110)
CO2: 25 mmol/L (ref 20–32)
Calcium: 9.5 mg/dL (ref 8.6–10.3)
Creat: 1.1 mg/dL (ref 0.60–1.35)
GLOBULIN: 2.5 g/dL (ref 1.9–3.7)
GLUCOSE: 96 mg/dL (ref 65–99)
Potassium: 4.2 mmol/L (ref 3.5–5.3)
Sodium: 140 mmol/L (ref 135–146)
Total Bilirubin: 0.6 mg/dL (ref 0.2–1.2)
Total Protein: 7.1 g/dL (ref 6.1–8.1)

## 2017-06-23 LAB — CBC
HCT: 41.5 % (ref 38.5–50.0)
HEMOGLOBIN: 14.6 g/dL (ref 13.2–17.1)
MCH: 31 pg (ref 27.0–33.0)
MCHC: 35.2 g/dL (ref 32.0–36.0)
MCV: 88.1 fL (ref 80.0–100.0)
MPV: 10.1 fL (ref 7.5–12.5)
Platelets: 261 10*3/uL (ref 140–400)
RBC: 4.71 10*6/uL (ref 4.20–5.80)
RDW: 12.3 % (ref 11.0–15.0)
WBC: 5 10*3/uL (ref 3.8–10.8)

## 2017-06-23 MED ORDER — MELOXICAM 15 MG PO TABS
15.0000 mg | ORAL_TABLET | Freq: Every day | ORAL | 0 refills | Status: DC
Start: 2017-06-23 — End: 2017-06-30

## 2017-06-23 MED ORDER — MELOXICAM 15 MG PO TABS: 15.0000 mg | ORAL_TABLET | Freq: Every day | ORAL | 0 refills | Status: DC

## 2017-06-23 NOTE — Patient Instructions (Addendum)
For your blood pressure: - Goal <130/80 - monitor and log blood pressures at home - check around the same time each day in a relaxed setting - Limit salt to <2000 mg/day - Follow DASH eating plan - limit alcohol to 2 standard drinks per day for men and 1 per day for women - avoid tobacco products - weight loss: 7% of current body weight - follow-up every 6 months for your blood pressure    Physical Activity Recommendations for modifying lipids and lowering blood pressure Engage in aerobic physical activity to reduce LDL-cholesterol, non-HDL-cholesterol, and blood pressure  Frequency: 3-4 sessions per week  Intensity: moderate to vigorous  Duration: 40 minutes on average   Physical Activity Recommendations for secondary prevention 1. Aerobic exercise  Frequency: 3-5 sessions per week  Intensity: 50-80% capacity  Duration: 20 - 60 minutes  Examples: walking, treadmill, cycling, rowing, stair climbing, and arm/leg ergometry  2. Resistance exercise  Frequency: 2-3 sessions per week  Intensity: 10-15 repetitions/set to moderate fatigue  Duration: 1-3 sets of 8-10 upper and lower body exercises  Examples: calisthenics, elastic bands, cuff/hand weights, dumbbels, free weights, wall pulleys, and weight machines  Heart-Healthy Lifestyle  Eating a diet rich in vegetables, fruits and whole grains: also includes low-fat dairy products, poultry, fish, legumes, and nuts; limit intake of sweets, sugar-sweetened beverages and red meats  Getting regular exercise  Maintaining a healthy weight  Not smoking or getting help quitting  Staying on top of your health; for some people, lifestyle changes alone may not be enough to prevent a heart attack or stroke. In these cases, taking a statin at the right dose will most likely be necessary  

## 2017-06-23 NOTE — Progress Notes (Signed)
HPI:                                                                Brian Rasmussen is a 42 y.o. male who [facility] today to establish care  Current concerns: biometrics for insurance, right foot pain  Patient reports pain in the second toe of his right foot, gradually worsening for approximately 3 months. Sometimes his entire foot hurts. He plays tennis regularly and reports he has tried custom orthotics in the past.    Depression screen PHQ 2/9 06/23/2017  Decreased Interest 0  Down, Depressed, Hopeless 0  PHQ - 2 Score 0    No flowsheet data found.    Past Medical History:  Diagnosis Date  . Chronic headaches    Past Surgical History:  Procedure Laterality Date  . TONSILLECTOMY     Social History   Tobacco Use  . Smoking status: Never Smoker  . Smokeless tobacco: Never Used  Substance Use Topics  . Alcohol use: Yes    Alcohol/week: 1.2 - 2.4 oz    Types: 2 - 4 Standard drinks or equivalent per week   family history includes Cancer in his paternal grandmother; Heart attack in his father; Hyperlipidemia in his unknown relative; Hypertension in his unknown relative.    ROS: negative except as noted in the HPI  Medications: Current Outpatient Medications  Medication Sig Dispense Refill  . Multiple Vitamins-Minerals (MULTIVITAMIN PO) Take by mouth.    . meloxicam (MOBIC) 15 MG tablet Take 1 tablet (15 mg total) by mouth daily. 30 tablet 0   No current facility-administered medications for this visit.    No Known Allergies     Objective:  BP 135/79   Pulse 66   Ht 5\' 11"  (1.803 m)   Wt 210 lb (95.3 kg)   BMI 29.29 kg/m  Gen:  alert, not ill-appearing, no distress, appropriate for age HEENT: head normocephalic without obvious abnormality, conjunctiva and cornea clear, trachea midline Pulm: Normal work of breathing, normal phonation, clear to auscultation bilaterally CV: Normal rate,  regular rhythm, s1 and s2 distinct, no murmurs, clicks or rubs  Neuro: alert and oriented x 3, no tremor MSK: bunion of the right first MTP, tenderness of the right second metatarsal head, no visible swelling, bruising or redness of the right foot; extremities atraumatic, normal gait and station Skin: intact, no rashes on exposed skin, no jaundice, no cyanosis Psych: well-groomed, cooperative, good eye contact, euthymic mood, affect mood-congruent, speech is articulate, and thought processes clear and goal-directed    No results found for this or any previous visit (from the past 72 hour(s)). No results found.    Assessment and Plan: 42 y.o. male with   1. Encounter to establish care - reviewed PMH, PSH, PFH, medications and allergies - reviewed health maintenance - declines influenza - negative PHQ2  2. Encounter for biometric screening - CBC - Comprehensive metabolic panel - Lipid Panel w/reflex Direct LDL  3. Overweight (BMI 25.0-29.9) Wt Readings from Last 3 Encounters:  06/23/17 210 lb (95.3 kg)  12/09/15 204 lb (92.5 kg)  09/08/15 200 lb (90.7 kg)  - counseled on weight loss through decreasing caloric intake and increasing aerobic exercise  4. Screening, lipid -  Lipid Panel w/reflex Direct LDL  5. Screening for diabetes mellitus - Comprehensive metabolic panel  6. Screening for blood disease - CBC  7. Metatarsalgia of right foot - avoid barefoot walking, wearing supportive shoes and custom orthotics daily, daily NSAID - follow-up with Sports for unloading - meloxicam (MOBIC) 15 MG tablet; Take 1 tablet (15 mg total) by mouth daily.  Dispense: 30 tablet; Refill: 0  8. Bunion of great toe of right foot   9. Elevated blood pressure reading BP Readings from Last 3 Encounters:  06/23/17 135/79  12/09/15 137/80  09/08/15 (!) 143/91  - active surveillance - counseled on therapeutic lifestyle changes   Patient education and anticipatory guidance  given Patient agrees with treatment plan Follow-up in 1 year for CPE or sooner as needed if symptoms worsen or fail to improve  Levonne Hubert PA-C

## 2017-06-26 ENCOUNTER — Encounter: Payer: Self-pay | Admitting: Physician Assistant

## 2017-06-26 DIAGNOSIS — E789 Disorder of lipoprotein metabolism, unspecified: Secondary | ICD-10-CM | POA: Insufficient documentation

## 2017-06-26 NOTE — Progress Notes (Signed)
Good afternoon Thayer OhmChris,  Your labs look good overall. Your "unhealthy" cholesterol (LDL) is still borderline elevated. Recommend low fat diet and increased aerobic exercise. We will continue to monitor it yearly.  Best, Vinetta Bergamoharley

## 2017-06-30 ENCOUNTER — Encounter: Payer: Self-pay | Admitting: Sports Medicine

## 2017-06-30 ENCOUNTER — Ambulatory Visit (INDEPENDENT_AMBULATORY_CARE_PROVIDER_SITE_OTHER): Payer: Managed Care, Other (non HMO) | Admitting: Sports Medicine

## 2017-06-30 DIAGNOSIS — M7741 Metatarsalgia, right foot: Secondary | ICD-10-CM | POA: Diagnosis not present

## 2017-06-30 NOTE — Progress Notes (Signed)
Subjective:    I'm seeing this patient as a consultation for: Gena Frayharley Cummings, PA-C  CC: Right foot pain  HPI: This is a pleasant 42 year old male, he works [employer], on his feet for 12 hours/day.  For many months he has had pain that he localizes on the plantar aspect of his right foot at the base of the second metatarsal head.  No trauma, moderate, persistent without radiation.  No paresthesias into the toes.  I reviewed the past medical history, family history, social history, surgical history, and allergies today and no changes were needed.  Please see the problem list section below in epic for further details.  Past Medical History: Past Medical History:  Diagnosis Date  . Chronic headaches    Past Surgical History: Past Surgical History:  Procedure Laterality Date  . TONSILLECTOMY     Social History: Social History   Socioeconomic History  . Marital status: Married    Spouse name: None  . Number of children: None  . Years of education: None  . Highest education level: None  Social Needs  . Financial resource strain: None  . Food insecurity - worry: None  . Food insecurity - inability: None  . Transportation needs - medical: None  . Transportation needs - non-medical: None  Occupational History  . None  Tobacco Use  . Smoking status: Never Smoker  . Smokeless tobacco: Never Used  Substance and Sexual Activity  . Alcohol use: Yes    Alcohol/week: 1.2 - 2.4 oz    Types: 2 - 4 Standard drinks or equivalent per week  . Drug use: No  . Sexual activity: Yes    Partners: Male  Other Topics Concern  . None  Social History Narrative  . None   Family History: Family History  Problem Relation Age of Onset  . Hyperlipidemia Unknown   . Hypertension Unknown   . Heart attack Father   . Cancer Paternal Grandmother    Allergies: No Known Allergies Medications: See med rec.  Review of Systems: No headache, visual changes, nausea, vomiting, diarrhea,  constipation, dizziness, abdominal pain, skin rash, fevers, chills, night sweats, weight loss, swollen lymph nodes, body aches, joint swelling, muscle aches, chest pain, shortness of breath, mood changes, visual or auditory hallucinations.   Objective:   General: Well Developed, well nourished, and in no acute distress.  Neuro:  Extra-ocular muscles intact, able to move all 4 extremities, sensation grossly intact.  Deep tendon reflexes tested were normal. Psych: Alert and oriented, mood congruent with affect. ENT:  Ears and nose appear unremarkable.  Hearing grossly normal. Neck: Unremarkable overall appearance, trachea midline.  No visible thyroid enlargement. Eyes: Conjunctivae and lids appear unremarkable.  Pupils equal and round. Skin: Warm and dry, no rashes noted.  Cardiovascular: Pulses palpable, no extremity edema. Right foot: No visible erythema or swelling. Range of motion is full in all directions. Strength is 5/5 in all directions. Moderate hallux valgus. Bilateral pes planus No abnormal callus noted. No pain over the navicular prominence, or base of fifth metatarsal. No tenderness to palpation of the calcaneal insertion of plantar fascia. No pain at the Achilles insertion. No pain over the calcaneal bursa. No pain of the retrocalcaneal bursa. There is tenderness over the plantar aspect of the second metatarsal head, the entire MTP feels somewhat enlarged. No hallux rigidus or limitus. No tenderness palpation over interphalangeal joints. No pain with compression of the metatarsal heads. Neurovascularly intact distally.  Impression and Recommendations:  This case required medical decision making of moderate complexity.  Metatarsalgia of right foot Patient will return for custom molded orthotics. He does have some swelling of the right second MTP likely due to either arthritis or an old trauma. If custom orthotics with a metatarsal pad do not improve his symptoms we will  consider injection of this joint. ___________________________________________ Ihor Austin. Benjamin Stain, M.D., ABFM., CAQSM. Primary Care and Sports Medicine [facility]  Adjunct Instructor of Family Medicine  [employer]

## 2017-06-30 NOTE — Assessment & Plan Note (Signed)
Patient will return for custom molded orthotics. He does have some swelling of the right second MTP likely due to either arthritis or an old trauma. If custom orthotics with a metatarsal pad do not improve his symptoms we will consider injection of this joint.

## 2017-07-04 ENCOUNTER — Encounter: Payer: Self-pay | Admitting: Sports Medicine

## 2017-07-04 ENCOUNTER — Ambulatory Visit (INDEPENDENT_AMBULATORY_CARE_PROVIDER_SITE_OTHER): Payer: Managed Care, Other (non HMO) | Admitting: Sports Medicine

## 2017-07-04 DIAGNOSIS — M7741 Metatarsalgia, right foot: Secondary | ICD-10-CM | POA: Diagnosis not present

## 2017-07-04 NOTE — Progress Notes (Signed)
    Patient was fitted for a : standard, cushioned, semi-rigid orthotic. The orthotic was heated and afterward the patient stood on the orthotic blank positioned on the orthotic stand. The patient was positioned in subtalar neutral position and 10 degrees of ankle dorsiflexion in a weight bearing stance. After completion of molding, a stable base was applied to the orthotic blank. The blank was ground to a stable position for weight bearing. Size: 14 Base: [facility] Additional Posting and Padding: right large MT pad. The patient ambulated these, and they were very comfortable.  I spent 40 minutes with this patient, greater than 50% was face-to-face time counseling regarding the below diagnosis.  ___________________________________________ Ihor Austinhomas J. Benjamin Stainhekkekandam, M.D., ABFM., CAQSM. Primary Care and Sports Medicine  [facility]  Adjunct Instructor of Family Medicine  [employer]

## 2017-07-04 NOTE — Assessment & Plan Note (Signed)
Orthotics as above with large right metatarsal pad. Return in 1 month.

## 2017-08-01 ENCOUNTER — Ambulatory Visit: Payer: Managed Care, Other (non HMO) | Admitting: Sports Medicine

## 2017-08-01 DIAGNOSIS — Z0189 Encounter for other specified special examinations: Secondary | ICD-10-CM

## 2020-08-16 LAB — HM DIABETES EYE EXAM

## 2020-09-07 ENCOUNTER — Ambulatory Visit (INDEPENDENT_AMBULATORY_CARE_PROVIDER_SITE_OTHER): Payer: Managed Care, Other (non HMO) | Admitting: Family Medicine

## 2020-09-07 ENCOUNTER — Other Ambulatory Visit: Payer: Self-pay

## 2020-09-07 ENCOUNTER — Encounter: Payer: Self-pay | Admitting: Family Medicine

## 2020-09-07 VITALS — BP 137/88 | HR 74 | Temp 98.2°F | Ht 68.5 in | Wt 206.5 lb

## 2020-09-07 DIAGNOSIS — G43109 Migraine with aura, not intractable, without status migrainosus: Secondary | ICD-10-CM

## 2020-09-07 NOTE — Assessment & Plan Note (Addendum)
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  Discussed CT of the head to evaluate for any structural abnormalities given new onset.  Has seen optometrist. Discussed keeping headache diary to see if there may be any triggers contributing.   He isn't having frequently enough that he wants to try medication for prevention at this time and doesn't last long enough to warrant abortive medications.

## 2020-09-07 NOTE — Patient Instructions (Signed)
Form - Headache Record  There are many types and causes of headaches. A headache record can help guide your treatment plan. Use this form to record the details. Bring this form with you to your follow-up visits.  Follow your health care provider's instructions on how to describe your headache. You may be asked to:  · Use a pain scale. This is a tool to rate the intensity of your headache using words or numbers.  · Describe what your headache feels like, such as dull, achy, throbbing, or sharp.  Headache record  Date: _______________ Time (from start to end): ____________________ Location of the headache: _________________________  · Intensity of the headache: ____________________ Description of the headache: ______________________________________________________________  · Hours of sleep the night before the headache: __________  · Food or drinks before the headache started: ______________________________________________________________________________________  · Events before the headache started: _______________________________________________________________________________________________  · Symptoms before the headache started: __________________________________________________________________________________________  · Symptoms during the headache: __________________________________________________________________________________________________  · Treatment: ________________________________________________________________________________________________________________  · Effect of treatment: _________________________________________________________________________________________________________  · Other comments: ___________________________________________________________________________________________________________  Date: _______________ Time (from start to end): ____________________ Location of the headache: _________________________  · Intensity of the headache: ____________________ Description of the  headache: ______________________________________________________________  · Hours of sleep the night before the headache: __________  · Food or drinks before the headache started: ______________________________________________________________________________________  · Events before the headache started: ____________________________________________________________________________________________  · Symptoms before the headache started: _________________________________________________________________________________________  · Symptoms during the headache: _______________________________________________________________________________________________  · Treatment: ________________________________________________________________________________________________________________  · Effect of treatment: _________________________________________________________________________________________________________  · Other comments: ___________________________________________________________________________________________________________  Date: _______________ Time (from start to end): ____________________ Location of the headache: _________________________  · Intensity of the headache: ____________________ Description of the headache: ______________________________________________________________  · Hours of sleep the night before the headache: __________  · Food or drinks before the headache started: ______________________________________________________________________________________  · Events before the headache started: ____________________________________________________________________________________________  · Symptoms before the headache started: _________________________________________________________________________________________  · Symptoms during the headache: _______________________________________________________________________________________________  · Treatment:  ________________________________________________________________________________________________________________  · Effect of treatment: _________________________________________________________________________________________________________  · Other comments: ___________________________________________________________________________________________________________  Date: _______________ Time (from start to end): ____________________ Location of the headache: _________________________  · Intensity of the headache: ____________________ Description of the headache: ______________________________________________________________  · Hours of sleep the night before the headache: _________  · Food or drinks before the headache started: ______________________________________________________________________________________  · Events before the headache started: ____________________________________________________________________________________________  · Symptoms before the headache started: _________________________________________________________________________________________  · Symptoms during the headache: _______________________________________________________________________________________________  · Treatment: ________________________________________________________________________________________________________________  · Effect of treatment: _________________________________________________________________________________________________________  · Other comments: ___________________________________________________________________________________________________________  Date: _______________ Time (from start to end): ____________________ Location of the headache: _________________________  · Intensity of the headache: ____________________ Description of the headache: ______________________________________________________________  · Hours of sleep the night before the headache: _________  · Food or drinks  before the headache started: ______________________________________________________________________________________  · Events before the headache started: ____________________________________________________________________________________________  · Symptoms before the headache started: _________________________________________________________________________________________  · Symptoms during the headache: _______________________________________________________________________________________________  · Treatment: ________________________________________________________________________________________________________________  · Effect of treatment: _________________________________________________________________________________________________________  · Other comments: ___________________________________________________________________________________________________________  This information is not intended to replace advice given to you by your health care provider. Make sure you discuss any questions you have with your health care provider.  Document Revised: 06/04/2018 Document Reviewed: 06/04/2018  Elsevier Patient Education © 2021 [employer]

## 2020-09-07 NOTE — Progress Notes (Addendum)
Brian Rasmussen - 45 y.o. male MRN [id number]  Date of birth: [birth year]  Subjective Chief Complaint  Patient presents with  . Establish Care    HPI Brian Rasmussen is a 45 y.o. male here today for initial visit.  He has been in pretty good health.  Reports that over the past few months he has had episodes of visual disturbances that include blurred vision and flashes of light.  He did see an optometrist and was told everything looked normal and his symptoms sounded like ocular migraines.  He does get headache that accompanies this occasionally.  His symptoms typically only last about 20 minutes and then resolve.  He has had about 8 episodes in the past few months.  His father has similar symptoms and was diagnosed with ocular migraines previously.    ROS:  A comprehensive ROS was completed and negative except as noted per HPI  No Known Allergies  Past Medical History:  Diagnosis Date  . Chronic headaches   . Visual disturbances     Past Surgical History:  Procedure Laterality Date  . TONSILLECTOMY      Social History   Socioeconomic History  . Marital status: Married    Spouse name: Not on file  . Number of children: Not on file  . Years of education: Not on file  . Highest education level: Not on file  Occupational History  . Occupation: Agricultural consultant  Tobacco Use  . Smoking status: Never Smoker  . Smokeless tobacco: Never Used  Vaping Use  . Vaping Use: Never used  Substance and Sexual Activity  . Alcohol use: Yes    Alcohol/week: 3.0 - 4.0 standard drinks    Types: 3 - 4 Standard drinks or equivalent per week  . Drug use: No  . Sexual activity: Yes    Partners: Male    Birth control/protection: Post-menopausal  Other Topics Concern  . Not on file  Social History Narrative  . Not on file   Social Determinants of Health   Financial Resource Strain: Not on file  Food Insecurity: Not on file  Transportation Needs: Not on file  Physical Activity: Not on  file  Stress: Not on file  Social Connections: Not on file    Family History  Problem Relation Age of Onset  . Hyperlipidemia Other   . Hypertension Other   . Heart attack Father   . Cancer Paternal Grandmother     Health Maintenance  Topic Date Due  . Hepatitis C Screening  Never done  . COLONOSCOPY (Pts 45-30yrs Insurance coverage will need to be confirmed)  Never done  . COVID-19 Vaccine (3 - Booster for Pfizer series) 03/05/2020  . INFLUENZA VACCINE  12/28/2020  . TETANUS/TDAP  11/06/2023  . HPV VACCINES  Aged Out     ----------------------------------------------------------------------------------------------------------------------------------------------------------------------------------------------------------------- Physical Exam BP 137/88 (BP Location: Left Arm, Patient Position: Sitting, Cuff Size: Normal)   Pulse 74   Temp 98.2 F (36.8 C)   Ht 5' 8.5" (1.74 m)   Wt 206 lb 8 oz (93.7 kg)   SpO2 100%   BMI 30.94 kg/m   Physical Exam Constitutional:      Appearance: Normal appearance.  HENT:     Head: Normocephalic and atraumatic.  Eyes:     Extraocular Movements: Extraocular movements intact.     Pupils: Pupils are equal, round, and reactive to light.  Cardiovascular:     Rate and Rhythm: Normal rate and regular rhythm.  Pulmonary:     Effort:  Pulmonary effort is normal.     Breath sounds: Normal breath sounds.  Musculoskeletal:     Cervical back: Neck supple.  Neurological:     General: No focal deficit present.     Mental Status: He is alert and oriented to person, place, and time. Mental status is at baseline.     Cranial Nerves: No cranial nerve deficit.     Motor: No weakness.  Psychiatric:        Mood and Affect: Mood normal.        Behavior: Behavior normal.      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  Discussed CT of the head to evaluate for any structural abnormalities given new onset.  Has seen optometrist. Discussed keeping headache diary to see if there may be any triggers contributing.   He isn't having frequently enough that he wants to try medication for prevention at this time and doesn't last long enough to warrant abortive medications.     No orders of the defined types were placed in this encounter.   No follow-ups on file.    This visit occurred during the SARS-CoV-2 public health emergency.  Safety protocols were in place, including screening questions prior to the visit, additional usage of staff PPE, and extensive cleaning of exam room while observing appropriate contact time as indicated for disinfecting solutions.

## 2020-09-09 ENCOUNTER — Encounter: Payer: Self-pay | Admitting: Family Medicine

## 2020-09-21 ENCOUNTER — Other Ambulatory Visit: Payer: Self-pay | Admitting: Family Medicine

## 2020-09-21 DIAGNOSIS — R42 Dizziness and giddiness: Secondary | ICD-10-CM

## 2020-09-21 DIAGNOSIS — G43109 Migraine with aura, not intractable, without status migrainosus: Secondary | ICD-10-CM

## 2020-09-21 NOTE — Addendum Note (Signed)
Addended by: Mammie Lorenzo on: 09/21/2020 04:58 PM   Modules accepted: Orders

## 2020-10-12 ENCOUNTER — Other Ambulatory Visit: Payer: Self-pay

## 2020-10-12 ENCOUNTER — Ambulatory Visit (INDEPENDENT_AMBULATORY_CARE_PROVIDER_SITE_OTHER): Payer: Managed Care, Other (non HMO) | Admitting: Family Medicine

## 2020-10-12 ENCOUNTER — Encounter: Payer: Self-pay | Admitting: Family Medicine

## 2020-10-12 VITALS — BP 144/93 | HR 64 | Ht 69.0 in | Wt 205.0 lb

## 2020-10-12 DIAGNOSIS — E789 Disorder of lipoprotein metabolism, unspecified: Secondary | ICD-10-CM | POA: Diagnosis not present

## 2020-10-12 DIAGNOSIS — R030 Elevated blood-pressure reading, without diagnosis of hypertension: Secondary | ICD-10-CM

## 2020-10-12 DIAGNOSIS — Z1211 Encounter for screening for malignant neoplasm of colon: Secondary | ICD-10-CM

## 2020-10-12 DIAGNOSIS — Z0000 Encounter for general adult medical examination without abnormal findings: Secondary | ICD-10-CM | POA: Insufficient documentation

## 2020-10-12 LAB — CBC WITH DIFFERENTIAL/PLATELET
MCH: 30.8 pg (ref 27.0–33.0)
Neutrophils Relative %: 65.8 %

## 2020-10-12 NOTE — Assessment & Plan Note (Signed)
Well adult Orders Placed This Encounter  Procedures  . COMPLETE METABOLIC PANEL WITH GFR  . CBC with Differential  . Lipid Panel w/reflex Direct LDL  . TSH  . Cologuard  Screening: Colon cancer screening discussed.  He elects to have cologuard-ordered.  Immunizations: UTD Anticipatory guidance/Risk factor reduction:  Recommendations per AVS.

## 2020-10-12 NOTE — Progress Notes (Signed)
Brian Rasmussen - 45 y.o. male MRN [id number]  Date of birth: 1976-03-07  Subjective Chief Complaint  Patient presents with  . Annual Exam    HPI Brian Rasmussen is a 45 y.o. male here today for annual exam.  He has been dealing with some visual disturbances which are likely ocular migraines.  He has MRI scheduled for next month.   He will have updated labs today.   He elects to have Cologuard for colon cancer screening.  No family history of colon cancer.   He does play tennis frequently for exercise and feels like his diet is pretty good.    Review of Systems  Constitutional: Negative for chills, fever, malaise/fatigue and weight loss.  HENT: Negative for congestion, ear pain and sore throat.   Eyes: Negative for blurred vision, double vision and pain.  Respiratory: Negative for cough and shortness of breath.   Cardiovascular: Negative for chest pain and palpitations.  Gastrointestinal: Negative for abdominal pain, blood in stool, constipation, heartburn and nausea.  Genitourinary: Negative for dysuria and urgency.  Musculoskeletal: Negative for joint pain and myalgias.  Neurological: Negative for dizziness and headaches.  Endo/Heme/Allergies: Does not bruise/bleed easily.  Psychiatric/Behavioral: Negative for depression. The patient is not nervous/anxious and does not have insomnia.     No Known Allergies  Past Medical History:  Diagnosis Date  . Chronic headaches   . Visual disturbances     Past Surgical History:  Procedure Laterality Date  . TONSILLECTOMY      Social History   Socioeconomic History  . Marital status: Married    Spouse name: Not on file  . Number of children: Not on file  . Years of education: Not on file  . Highest education level: Not on file  Occupational History  . Occupation: Agricultural consultant  Tobacco Use  . Smoking status: Never Smoker  . Smokeless tobacco: Never Used  Vaping Use  . Vaping Use: Never used  Substance and Sexual  Activity  . Alcohol use: Yes    Alcohol/week: 3.0 - 4.0 standard drinks    Types: 3 - 4 Standard drinks or equivalent per week  . Drug use: No  . Sexual activity: Yes    Partners: Male    Birth control/protection: Post-menopausal  Other Topics Concern  . Not on file  Social History Narrative  . Not on file   Social Determinants of Health   Financial Resource Strain: Not on file  Food Insecurity: Not on file  Transportation Needs: Not on file  Physical Activity: Not on file  Stress: Not on file  Social Connections: Not on file    Family History  Problem Relation Age of Onset  . Hyperlipidemia Other   . Hypertension Other   . Heart attack Father   . Cancer Paternal Grandmother     Health Maintenance  Topic Date Due  . Hepatitis C Screening  Never done  . COLONOSCOPY (Pts 45-72yrs Insurance coverage will need to be confirmed)  Never done  . COVID-19 Vaccine (3 - Booster for Pfizer series) 02/04/2020  . INFLUENZA VACCINE  12/28/2020  . TETANUS/TDAP  11/06/2023  . HPV VACCINES  Aged Out     ----------------------------------------------------------------------------------------------------------------------------------------------------------------------------------------------------------------- Physical Exam BP (!) 144/93 (BP Location: Right Arm, Patient Position: Sitting, Cuff Size: Normal)   Pulse 64   Ht 5\' 9"  (1.753 m)   Wt 205 lb (93 kg)   SpO2 99%   BMI 30.27 kg/m   Physical Exam Constitutional:  General: He is not in acute distress. HENT:     Head: Normocephalic and atraumatic.     Right Ear: External ear normal.     Left Ear: External ear normal.  Eyes:     General: No scleral icterus. Neck:     Thyroid: No thyromegaly.  Cardiovascular:     Rate and Rhythm: Normal rate and regular rhythm.     Heart sounds: Normal heart sounds.  Pulmonary:     Effort: Pulmonary effort is normal.     Breath sounds: Normal breath sounds.  Abdominal:      General: Bowel sounds are normal. There is no distension.     Palpations: Abdomen is soft.     Tenderness: There is no abdominal tenderness. There is no guarding.  Musculoskeletal:     Cervical back: Normal range of motion.  Lymphadenopathy:     Cervical: No cervical adenopathy.  Skin:    General: Skin is warm and dry.     Findings: No rash.  Neurological:     Mental Status: He is alert and oriented to person, place, and time.     Cranial Nerves: No cranial nerve deficit.     Motor: No abnormal muscle tone.  Psychiatric:        Mood and Affect: Mood normal.        Behavior: Behavior normal.     ------------------------------------------------------------------------------------------------------------------------------------------------------------------------------------------------------------------- Assessment and Plan  Well adult exam Well adult Orders Placed This Encounter  Procedures  . COMPLETE METABOLIC PANEL WITH GFR  . CBC with Differential  . Lipid Panel w/reflex Direct LDL  . TSH  . Cologuard  Screening: Colon cancer screening discussed.  He elects to have cologuard-ordered.  Immunizations: UTD Anticipatory guidance/Risk factor reduction:  Recommendations per AVS.    No orders of the defined types were placed in this encounter.   No follow-ups on file.    This visit occurred during the SARS-CoV-2 public health emergency.  Safety protocols were in place, including screening questions prior to the visit, additional usage of staff PPE, and extensive cleaning of exam room while observing appropriate contact time as indicated for disinfecting solutions.

## 2020-10-12 NOTE — Patient Instructions (Signed)

## 2020-10-13 LAB — LIPID PANEL W/REFLEX DIRECT LDL
Cholesterol: 210 mg/dL — ABNORMAL HIGH (ref <200)
HDL: 50 mg/dL (ref >=40)
LDL Cholesterol (Calc): 144 mg/dL (calc) — ABNORMAL HIGH
Non-HDL Cholesterol (Calc): 160 mg/dL (calc) — ABNORMAL HIGH (ref <130)
Total CHOL/HDL Ratio: 4.2 (calc) (ref <5.0)
Triglycerides: 65 mg/dL (ref <150)

## 2020-10-13 LAB — COMPLETE METABOLIC PANEL WITH GFR
AG Ratio: 1.9 (calc) (ref 1.0–2.5)
ALT: 19 U/L (ref 9–46)
AST: 21 U/L (ref 10–40)
Albumin: 4.7 g/dL (ref 3.6–5.1)
Alkaline phosphatase (APISO): 52 U/L (ref 36–130)
BUN: 18 mg/dL (ref 7–25)
CO2: 28 mmol/L (ref 20–32)
Calcium: 9.5 mg/dL (ref 8.6–10.3)
Chloride: 106 mmol/L (ref 98–110)
Creat: 1.01 mg/dL (ref 0.60–1.35)
GFR, Est African American: 103 mL/min/{1.73_m2} (ref >=60)
GFR, Est Non African American: 89 mL/min/{1.73_m2} (ref >=60)
Globulin: 2.5 g/dL (calc) (ref 1.9–3.7)
Glucose, Bld: 102 mg/dL — ABNORMAL HIGH (ref 65–99)
Potassium: 4.3 mmol/L (ref 3.5–5.3)
Sodium: 142 mmol/L (ref 135–146)
Total Bilirubin: 0.6 mg/dL (ref 0.2–1.2)
Total Protein: 7.2 g/dL (ref 6.1–8.1)

## 2020-10-13 LAB — CBC WITH DIFFERENTIAL/PLATELET
Absolute Monocytes: 484 cells/uL (ref 200–950)
Basophils Absolute: 22 cells/uL (ref 0–200)
Basophils Relative: 0.4 %
Eosinophils Absolute: 99 cells/uL (ref 15–500)
Eosinophils Relative: 1.8 %
HCT: 43.9 % (ref 38.5–50.0)
Hemoglobin: 15 g/dL (ref 13.2–17.1)
Lymphs Abs: 1276 cells/uL (ref 850–3900)
MCHC: 34.2 g/dL (ref 32.0–36.0)
MCV: 90.1 fL (ref 80.0–100.0)
MPV: 9.9 fL (ref 7.5–12.5)
Monocytes Relative: 8.8 %
Neutro Abs: 3619 cells/uL (ref 1500–7800)
Platelets: 242 10*3/uL (ref 140–400)
RBC: 4.87 10*6/uL (ref 4.20–5.80)
RDW: 12.6 % (ref 11.0–15.0)
Total Lymphocyte: 23.2 %
WBC: 5.5 10*3/uL (ref 3.8–10.8)

## 2020-10-13 LAB — TSH: TSH: 1.57 mIU/L (ref 0.40–4.50)

## 2020-11-02 ENCOUNTER — Ambulatory Visit (INDEPENDENT_AMBULATORY_CARE_PROVIDER_SITE_OTHER): Payer: Managed Care, Other (non HMO)

## 2020-11-02 ENCOUNTER — Other Ambulatory Visit: Payer: Self-pay

## 2020-11-02 DIAGNOSIS — G43109 Migraine with aura, not intractable, without status migrainosus: Secondary | ICD-10-CM

## 2020-11-02 DIAGNOSIS — R42 Dizziness and giddiness: Secondary | ICD-10-CM | POA: Diagnosis not present

## 2020-11-02 MED ORDER — GADOBUTROL 1 MMOL/ML IV SOLN
9.0000 mL | Freq: Once | INTRAVENOUS | Status: AC | PRN
  Administered 2020-11-02: 9 mL via INTRAVENOUS

## 2021-01-28 ENCOUNTER — Ambulatory Visit: Payer: Managed Care, Other (non HMO) | Admitting: Family Medicine

## 2021-01-28 ENCOUNTER — Telehealth: Payer: Self-pay | Admitting: Family Medicine

## 2021-01-28 NOTE — Telephone Encounter (Signed)
Patient missed appointment this morning due to being sick. Has rescheduled.

## 2021-02-09 ENCOUNTER — Encounter: Payer: Self-pay | Admitting: Family Medicine

## 2021-02-09 ENCOUNTER — Ambulatory Visit: Payer: Managed Care, Other (non HMO) | Admitting: Family Medicine

## 2021-02-09 ENCOUNTER — Other Ambulatory Visit: Payer: Self-pay

## 2021-02-09 VITALS — BP 133/84 | HR 76 | Temp 98.8°F | Ht 69.0 in | Wt 207.0 lb

## 2021-02-09 DIAGNOSIS — M549 Dorsalgia, unspecified: Secondary | ICD-10-CM

## 2021-02-09 DIAGNOSIS — G4730 Sleep apnea, unspecified: Secondary | ICD-10-CM | POA: Diagnosis not present

## 2021-02-09 DIAGNOSIS — R400 Somnolence: Secondary | ICD-10-CM | POA: Diagnosis not present

## 2021-02-09 NOTE — Progress Notes (Signed)
Brian Rasmussen - 45 y.o. male MRN [id number]  Date of birth: 11/18/1975  Subjective Chief Complaint  Patient presents with   Fatigue   Back Pain    HPI Brian Rasmussen is a 45 year old male here today with complaint of fatigue and upper back pain.  Reports some difficulty with sleep.  He wakes up a few times each night and has some difficulty falling back asleep.  Does not feel well rested in the morning.  He does feel some fatigue during the day.  Reports that when he gets home in the evenings he falls asleep shortly after sitting down.  He has had some snoring and some witnessed apnea at home.  Also complains of left-sided upper back pain.  This been ongoing for a few weeks.  Does not recall any injury.  There may be a component of overuse related to this due to tennis.  Pain does not radiate into the arms, he denies numbness or tingling.  ROS:  A comprehensive ROS was completed and negative except as noted per HPI  No Known Allergies  Past Medical History:  Diagnosis Date   Chronic headaches    Visual disturbances     Past Surgical History:  Procedure Laterality Date   TONSILLECTOMY      Social History   Socioeconomic History   Marital status: Significant Other    Spouse name: Not on file   Number of children: Not on file   Years of education: Not on file   Highest education level: Not on file  Occupational History   Occupation: Agricultural consultant  Tobacco Use   Smoking status: Never   Smokeless tobacco: Never  Vaping Use   Vaping Use: Never used  Substance and Sexual Activity   Alcohol use: Yes    Alcohol/week: 3.0 - 4.0 standard drinks    Types: 3 - 4 Standard drinks or equivalent per week   Drug use: No   Sexual activity: Yes    Partners: Male    Birth control/protection: Post-menopausal  Other Topics Concern   Not on file  Social History Narrative   Not on file   Social Determinants of Health   Financial Resource Strain: Not on file  Food Insecurity: Not on  file  Transportation Needs: Not on file  Physical Activity: Not on file  Stress: Not on file  Social Connections: Not on file    Family History  Problem Relation Age of Onset   Hyperlipidemia Other    Hypertension Other    Heart attack Father    Cancer Paternal Grandmother     Health Maintenance  Topic Date Due   Hepatitis C Screening  Never done   COVID-19 Vaccine (3 - Booster for Pfizer series) 02/25/2021 (Originally 02/04/2020)   INFLUENZA VACCINE  08/27/2021 (Originally 12/28/2020)   COLONOSCOPY (Pts 45-57yrs Insurance coverage will need to be confirmed)  02/09/2022 (Originally 02/06/2019)   TETANUS/TDAP  11/06/2023   Pneumococcal Vaccine 77-8 Years old  Aged Out   HPV VACCINES  Aged Out     ----------------------------------------------------------------------------------------------------------------------------------------------------------------------------------------------------------------- Physical Exam BP 133/84   Pulse 76   Temp 98.8 F (37.1 C)   Ht 5\' 9"  (1.753 m)   Wt 207 lb (93.9 kg)   SpO2 100%   BMI 30.57 kg/m   Physical Exam Constitutional:      Appearance: Normal appearance.  HENT:     Head: Normocephalic and atraumatic.  Eyes:     General: No scleral icterus. Cardiovascular:     Rate and  Rhythm: Normal rate and regular rhythm.  Musculoskeletal:     Cervical back: Neck supple.  Neurological:     General: No focal deficit present.     Mental Status: He is alert.  Psychiatric:        Mood and Affect: Mood normal.        Behavior: Behavior normal.    ------------------------------------------------------------------------------------------------------------------------------------------------------------------------------------------------------------------- Assessment and Plan  Daytime somnolence Based on current symptoms and observations at home I am concerned about the potential for sleep apnea.  Referral placed for sleep  medicine.  Upper back pain on left side Possibly related to muscle strain however I think this may be more radicular in nature.  Sending for trial of physical therapy initially.  If not improving with this we can consider imaging   No orders of the defined types were placed in this encounter.   No follow-ups on file.    This visit occurred during the SARS-CoV-2 public health emergency.  Safety protocols were in place, including screening questions prior to the visit, additional usage of staff PPE, and extensive cleaning of exam room while observing appropriate contact time as indicated for disinfecting solutions.

## 2021-02-09 NOTE — Assessment & Plan Note (Signed)
Possibly related to muscle strain however I think this may be more radicular in nature.  Sending for trial of physical therapy initially.  If not improving with this we can consider imaging

## 2021-02-09 NOTE — Assessment & Plan Note (Signed)
Based on current symptoms and observations at home I am concerned about the potential for sleep apnea.  Referral placed for sleep medicine.

## 2021-02-12 ENCOUNTER — Ambulatory Visit: Payer: Managed Care, Other (non HMO) | Admitting: Rehabilitative and Restorative Service Providers"

## 2021-02-15 ENCOUNTER — Other Ambulatory Visit: Payer: Self-pay

## 2021-02-15 ENCOUNTER — Encounter: Payer: Self-pay | Admitting: Rehabilitative and Restorative Service Providers"

## 2021-02-15 ENCOUNTER — Ambulatory Visit: Payer: Managed Care, Other (non HMO) | Admitting: Rehabilitative and Restorative Service Providers"

## 2021-02-15 DIAGNOSIS — R29898 Other symptoms and signs involving the musculoskeletal system: Secondary | ICD-10-CM

## 2021-02-15 DIAGNOSIS — M6281 Muscle weakness (generalized): Secondary | ICD-10-CM

## 2021-02-15 DIAGNOSIS — M545 Low back pain, unspecified: Secondary | ICD-10-CM | POA: Diagnosis not present

## 2021-02-15 DIAGNOSIS — M546 Pain in thoracic spine: Secondary | ICD-10-CM | POA: Diagnosis not present

## 2021-02-15 DIAGNOSIS — R293 Abnormal posture: Secondary | ICD-10-CM

## 2021-02-15 NOTE — Patient Instructions (Signed)
Access Code: [email or web address] URL: [email or web address] Date: 02/15/2021 Prepared by: Corlis Leak  Exercises Prone Press Up - 2 x daily - 7 x weekly - 1 sets - 10 reps - 2-3 sec hold Prone Press Up On Elbows - 2 x daily - 7 x weekly - 1 sets - 3 reps - 30 sec hold Supine Piriformis Stretch with Leg Straight - 2 x daily - 7 x weekly - 1 sets - 3 reps - 30 sec hold Hooklying Hamstring Stretch with Strap - 2 x daily - 7 x weekly - 1 sets - 3 reps - 30 sec hold Supine Transversus Abdominis Bracing with Pelvic Floor Contraction - 2 x daily - 7 x weekly - 1 sets - 10 reps - 10sec hold Doorway Pec Stretch at 60 Degrees Abduction - 3 x daily - 7 x weekly - 3 reps - 1 sets Doorway Pec Stretch at 90 Degrees Abduction - 3 x daily - 7 x weekly - 3 reps - 1 sets - 30 seconds hold Doorway Pec Stretch at 120 Degrees Abduction - 3 x daily - 7 x weekly - 3 reps - 1 sets - 30 second hold hold  Patient Education Posture and Body Mechanics Office Posture Trigger Point Dry Needling TENS Unit

## 2021-02-15 NOTE — Therapy (Signed)
[facility] [place] [address] [place], [place], [address] Phone: [phone]   Fax:  [phone]  Physical Therapy Evaluation  Patient Details  Name: Brian Rasmussen MRN: [id number] Date of Birth: 16-Jan-1976 Referring Provider (PT): Dr Everrett Coombe   Encounter Date: 02/15/2021   PT End of Session - 02/15/21 1712     Visit Number 1    Number of Visits 12    Date for PT Re-Evaluation 03/29/21    PT Start Time 1604    PT Stop Time 1700    PT Time Calculation (min) 56 min    Activity Tolerance Patient tolerated treatment well             Past Medical History:  Diagnosis Date   Chronic headaches    Visual disturbances     Past Surgical History:  Procedure Laterality Date   TONSILLECTOMY      There were no vitals filed for this visit.    Subjective Assessment - 02/15/21 1607     Subjective Patient reports that he has had painin the upper back for the past year with variable symptoms. He has been treated with chiropractic care with ~ 10 sessions with no change. He plays a lot of tennis and wonders if he irritates the upper back with tennis. He has had some pain in the center of LB 02/11/21 when bending over to pick up a tennis ball. now feels pain more on the Rt LB and ito the Rt buttocks into the anterior lateral thigh but not below the knee.    Pertinent History migraines    Patient Stated Goals get rid of the tightness and pain in the upper back    Currently in Pain? Yes    Pain Score 1     Pain Location Thoracic    Pain Orientation Left    Pain Descriptors / Indicators Nagging    Pain Type Chronic pain    Pain Radiating Towards pulling on neck; under arm along side of chest occasionally    Pain Onset More than a month ago    Pain Frequency Intermittent    Aggravating Factors  looking up leaning head back; tennis    Pain Relieving Factors time                [facility] PT Assessment - 02/15/21 0001        Assessment   Medical Diagnosis Thoracic pain; LBP    Referring Provider (PT) Dr Everrett Coombe    Onset Date/Surgical Date 09/28/19   LBP flare up 02/11/21   Hand Dominance Right    Next MD Visit as needed    Prior Therapy chiropractic care; massage therapy      Precautions   Precautions None      Restrictions   Weight Bearing Restrictions No      Balance Screen   Has the patient fallen in the past 6 months No    Has the patient had a decrease in activity level because of a fear of falling?  No    Is the patient reluctant to leave their home because of a fear of falling?  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%; standing 45%  Leisure tennis 4-6 x/wk ~ 2 hours- singles and doubles; gym free weights but not in the past 2 monts      Observation/Other Assessments   Focus on Therapeutic Outcomes (FOTO)  55      Sensation   Additional Comments WLF's per pt report      Posture/Postural Control   Posture Comments head forward; Lt shoulder girdle elevated compared to Rt; shoulders rounded and elevated; head of the humerus anterior in orientation; scapulae abducted and rotated along the thoracic wall - sits iwht rounded posture, decreased lumbar lordosis; LE's crossed      AROM   Cervical Flexion 62 pulling Lt upper back to mid shoulder blade area    Cervical Extension 32 pulling in Lt upper back to mid shoulder blades    Cervical - Right Side Bend 32    Cervical - Left Side Bend 40    Cervical - Right Rotation 65    Cervical - Left Rotation 64 tight    Lumbar Flexion 50% pain in LB    Lumbar Extension 60% no pain    Lumbar - Right Side Bend 65% pain in Rt LB    Lumbar - Left Side Bend 80% pulling in LB    Lumbar - Right Rotation 85%    Lumbar - Left Rotation 85%       Strength   Overall Strength Comments WFL's not tested resistively      Flexibility   Hamstrings Rt 55 deg; Lt 50 deg    Quadriceps WFL's bilat    ITB end range tightness    Piriformis tight Rt > Lt      Palpation   Spinal mobility hypomobility thoracic and lumbar spine with PA mobs    Palpation comment muscular tightness through ant/lat/post cervical musculature; pecs; upper trap; Rt QL/piriformis      Balance   Balance Assessed --   SLS ~ 10 sec each LE                       Objective measurements completed on examination: See above findings.       [facility] Adult PT Treatment/Exercise - 02/15/21 0001       Self-Care   Self-Care Other Self-Care Comments    Other Self-Care Comments  initiated back care education      Therapeutic Activites    Other Therapeutic Activities spinal alignment in sitting      Lumbar Exercises: Stretches   Passive Hamstring Stretch Right;Left;1 rep;30 seconds    Passive Hamstring Stretch Limitations supine with strap    Prone on Elbows Stretch 1 rep;30 seconds    Press Ups 5 reps;5 seconds    Piriformis Stretch Right;Left;1 rep;30 seconds      Lumbar Exercises: Supine   AB Set Limitations 4 part core 10 sec x 5 reps      Shoulder Exercises: ROM/Strengthening   Other ROM/Strengthening Exercises 3 position doorway 30 sec each x 1 rep each position                     PT Education - 02/15/21 1701     Education Details POC HEP posture; office ergonomics; DN; TENs    Person(s) Educated Patient    Methods Explanation;Demonstration;Tactile cues;Verbal cues;Handout    Comprehension Verbalized understanding;Returned demonstration;Verbal cues required;Tactile cues required                 PT Long Term Goals - 02/15/21 1723  PT LONG TERM GOAL #1   Title Decrease pain through upper to mid thoracic spine to improve functional and recreational activities    Time 6    Period Weeks    Status New     Target Date 03/29/21      PT LONG TERM GOAL #2   Title Increase cervical ROM in all planes to WNL's and pain free    Time 6    Period Weeks    Status New    Target Date 03/29/21      PT LONG TERM GOAL #3   Title Improve upper and lower core stability and strength allowing patient to play 1-2 hours of tennis without increase in symptoms    Time 6    Period Weeks    Status New    Target Date 03/29/21      PT LONG TERM GOAL #4   Title Independent in HEP (including aquatic therapy as indicated)    Time 6    Period Weeks    Status New    Target Date 03/29/21      PT LONG TERM GOAL #5   Title Improve functional limitation score to 76    Time 6    Period Weeks    Status New    Target Date 03/29/21                    Plan - 02/15/21 1712     Clinical Impression Statement Patient presents with upper back/thoracic back pain for > 1 year with variable intensity of symptoms. He reports a flare up of recurrent LBP 02/11/21 while bending over to pick up a tennis ball. Patient has poor posture and alignment; limited trunk and cervical mobility/ROM; muscular tightness to palpatioin; muscular imbalance; poor core strength/stability; pain with functional and recreational activities    Personal Factors and Comorbidities Past/Current Experience;Comorbidity 1;Time since onset of injury/illness/exacerbation    Comorbidities chronic pain; multiple body areas    Examination-Activity Limitations Lift;Bend    Examination-Participation Restrictions Other;Yard Work    Conservation officer, historic buildings Evolving/Moderate complexity    Clinical Decision Making Moderate    Rehab Potential Good    PT Frequency 2x / week    PT Duration 6 weeks    PT Treatment/Interventions ADLs/Self Care Home Management;Aquatic Therapy;Cryotherapy;Electrical Stimulation;Iontophoresis 4mg /ml Dexamethasone;Moist Heat;Ultrasound;Therapeutic activities;Therapeutic exercise;Neuromuscular re-education;Patient/family  education;Manual techniques;Dry needling;Taping    PT Next Visit Plan review HEP; add posterior shoulder girdle exercises; progress with core stabilization/strengthening; continue spine care education; trial of TENS unit for possible purchase for home use; specific stretches for tennis    PT Home Exercise Plan [email or web address]    Consulted and Agree with Plan of Care Patient             Patient will benefit from skilled therapeutic intervention in order to improve the following deficits and impairments:  Decreased range of motion, Increased fascial restricitons, Decreased activity tolerance, Pain, Hypomobility, Impaired flexibility, Improper body mechanics, Decreased mobility, Decreased strength, Postural dysfunction  Visit Diagnosis: Pain in thoracic spine  Acute bilateral low back pain without sciatica  Other symptoms and signs involving the musculoskeletal system  Abnormal posture  Muscle weakness (generalized)     Problem List Patient Active Problem List   Diagnosis Date Noted   Upper back pain on left side 02/09/2021   Daytime somnolence 02/09/2021   Well adult exam 10/12/2020   Ocular migraine 09/07/2020   Borderline high cholesterol 06/26/2017   Overweight (BMI 25.0-29.9) 06/23/2017  Metatarsalgia of right foot 06/23/2017   Bunion of great toe of right foot 06/23/2017   Elevated blood pressure reading 06/23/2017    Pearlean Sabina Rober Minion, PT, MPH  02/15/2021, 5:29 PM  [facility] [address] [place], [place], [address] Phone: [phone]   Fax:  [phone]  Name: Brian Rasmussen MRN: [id number] Date of Birth: 09-04-1975

## 2021-02-18 ENCOUNTER — Other Ambulatory Visit: Payer: Self-pay

## 2021-02-18 ENCOUNTER — Ambulatory Visit: Payer: Managed Care, Other (non HMO) | Admitting: Physical Therapy

## 2021-02-18 DIAGNOSIS — M546 Pain in thoracic spine: Secondary | ICD-10-CM

## 2021-02-18 DIAGNOSIS — R293 Abnormal posture: Secondary | ICD-10-CM | POA: Diagnosis not present

## 2021-02-18 DIAGNOSIS — R29898 Other symptoms and signs involving the musculoskeletal system: Secondary | ICD-10-CM | POA: Diagnosis not present

## 2021-02-18 DIAGNOSIS — M545 Low back pain, unspecified: Secondary | ICD-10-CM | POA: Diagnosis not present

## 2021-02-18 NOTE — Therapy (Signed)
[facility] [place] [address] [place], [place], [address] Phone: [phone]   Fax:  [phone]  Physical Therapy Treatment  Patient Details  Name: Brian Rasmussen MRN: [id number] Date of Birth: Jan 28, 1976 Referring Provider (PT): Dr Everrett Coombe   Encounter Date: 02/18/2021   PT End of Session - 02/18/21 1015     Visit Number 2    Number of Visits 12    Date for PT Re-Evaluation 03/29/21    PT Start Time 0932    PT Stop Time 1020    PT Time Calculation (min) 48 min    Activity Tolerance Patient tolerated treatment well    Behavior During Therapy [facility] for tasks assessed/performed             Past Medical History:  Diagnosis Date   Chronic headaches    Visual disturbances     Past Surgical History:  Procedure Laterality Date   TONSILLECTOMY      There were no vitals filed for this visit.   Subjective Assessment - 02/18/21 0933     Subjective Pt reports continued pain in Lt shoulder/thoracic area.  He complains of pain in low back and into Rt hip. Wonders if this is from the stretches.    Currently in Pain? Yes    Pain Score 2     Pain Location Thoracic    Pain Orientation Left    Pain Descriptors / Indicators Nagging    Aggravating Factors  looking up.    Pain Relieving Factors time    Multiple Pain Sites Yes    Pain Score 2    Pain Location Back    Pain Orientation Right    Pain Descriptors / Indicators Aching;Dull    Aggravating Factors  ?    Pain Relieving Factors ?                [facility] PT Assessment - 02/18/21 0001       Assessment   Medical Diagnosis Thoracic pain; LBP    Referring Provider (PT) Dr Everrett Coombe    Onset Date/Surgical Date 09/28/19   LBP flare up 02/11/21   Hand Dominance Right    Next MD Visit as needed    Prior Therapy chiropractic care; massage therapy              [facility] Adult PT Treatment/Exercise - 02/18/21 0001       Self-Care   Other Self-Care  Comments  Pt instructed in self massage with ball and roller stick; pt returned demo with cues.      Lumbar Exercises: Stretches   Passive Hamstring Stretch Right;Left;3 reps;20 seconds   supine x 2, seated x 1   Prone on Elbows Stretch 2 reps;30 seconds    Press Ups 2 reps;5 seconds    Piriformis Stretch Right;Left;2 reps;20 seconds      Lumbar Exercises: Seated   Other Seated Lumbar Exercises seated lap press x 3 sec x 2 reps      Lumbar Exercises: Supine   Ab Set 5 reps;5 seconds      Lumbar Exercises: Prone   Opposite Arm/Leg Raise Right arm/Left leg;Left arm/Right leg;5 reps;2 seconds      Shoulder Exercises: Stretch   Other Shoulder Stretches 3 position doorway stretch x 15 sec x 2 reps each position, straight arm pec stretch x 15 sec, overhead doorway stretch x 10 sec      Modalities   Modalities Moist Heat;Electrical Stimulation  Moist Heat Therapy   Number Minutes Moist Heat 10 Minutes    Moist Heat Location Shoulder;Cervical      Electrical Stimulation   Electrical Stimulation Location Lt levator/rhomboid area    Electrical Stimulation Action TENS    Electrical Stimulation Parameters intensity to tolerance, 10 min    Electrical Stimulation Goals Pain                          PT Long Term Goals - 02/15/21 1723       PT LONG TERM GOAL #1   Title Decrease pain through upper to mid thoracic spine to improve functional and recreational activities    Time 6    Period Weeks    Status New    Target Date 03/29/21      PT LONG TERM GOAL #2   Title Increase cervical ROM in all planes to WNL's and pain free    Time 6    Period Weeks    Status New    Target Date 03/29/21      PT LONG TERM GOAL #3   Title Improve upper and lower core stability and strength allowing patient to play 1-2 hours of tennis without increase in symptoms    Time 6    Period Weeks    Status New    Target Date 03/29/21      PT LONG TERM GOAL #4   Title Independent in  HEP (including aquatic therapy as indicated)    Time 6    Period Weeks    Status New    Target Date 03/29/21      PT LONG TERM GOAL #5   Title Improve functional limitation score to 76    Time 6    Period Weeks    Status New    Target Date 03/29/21                   Plan - 02/18/21 1243     Clinical Impression Statement Reviewed current HEP with minor cues for form.  Pt shown alternatives for hamstring and piriformis stretches.  All tolerated well.  Goals are ongoing.    Personal Factors and Comorbidities Past/Current Experience;Comorbidity 1;Time since onset of injury/illness/exacerbation    Comorbidities chronic pain; multiple body areas    Examination-Activity Limitations Lift;Bend    Examination-Participation Restrictions Other;Yard Work    Conservation officer, historic buildings Evolving/Moderate complexity    Rehab Potential Good    PT Frequency 2x / week    PT Duration 6 weeks    PT Treatment/Interventions ADLs/Self Care Home Management;Aquatic Therapy;Cryotherapy;Electrical Stimulation;Iontophoresis 4mg /ml Dexamethasone;Moist Heat;Ultrasound;Therapeutic activities;Therapeutic exercise;Neuromuscular re-education;Patient/family education;Manual techniques;Dry needling;Taping    PT Next Visit Plan add posterior shoulder girdle exercises; progress with core stabilization/strengthening; continue spine care education; specific stretches for tennis    PT Home Exercise Plan [email or web address]    Consulted and Agree with Plan of Care Patient             Patient will benefit from skilled therapeutic intervention in order to improve the following deficits and impairments:  Decreased range of motion, Increased fascial restricitons, Decreased activity tolerance, Pain, Hypomobility, Impaired flexibility, Improper body mechanics, Decreased mobility, Decreased strength, Postural dysfunction  Visit Diagnosis: Pain in thoracic spine  Acute bilateral low back pain without sciatica  Other  symptoms and signs involving the musculoskeletal system  Abnormal posture     Problem List Patient Active Problem List   Diagnosis Date Noted  Upper back pain on left side 02/09/2021   Daytime somnolence 02/09/2021   Well adult exam 10/12/2020   Ocular migraine 09/07/2020   Borderline high cholesterol 06/26/2017   Overweight (BMI 25.0-29.9) 06/23/2017   Metatarsalgia of right foot 06/23/2017   Bunion of great toe of right foot 06/23/2017   Elevated blood pressure reading 06/23/2017   Mayer Camel, PTA 02/18/21 12:47 PM  [facility] [place] [address] [place], [place], [address] Phone: [phone]   Fax:  [phone]  Name: Brian Rasmussen MRN: [id number] Date of Birth: 25-May-1976

## 2021-02-18 NOTE — Patient Instructions (Signed)

## 2021-02-22 ENCOUNTER — Encounter: Payer: Self-pay | Admitting: Rehabilitative and Restorative Service Providers"

## 2021-02-22 ENCOUNTER — Ambulatory Visit: Payer: Managed Care, Other (non HMO) | Admitting: Rehabilitative and Restorative Service Providers"

## 2021-02-22 ENCOUNTER — Other Ambulatory Visit: Payer: Self-pay

## 2021-02-22 DIAGNOSIS — M546 Pain in thoracic spine: Secondary | ICD-10-CM | POA: Diagnosis not present

## 2021-02-22 DIAGNOSIS — M6281 Muscle weakness (generalized): Secondary | ICD-10-CM

## 2021-02-22 DIAGNOSIS — R29898 Other symptoms and signs involving the musculoskeletal system: Secondary | ICD-10-CM | POA: Diagnosis not present

## 2021-02-22 DIAGNOSIS — R293 Abnormal posture: Secondary | ICD-10-CM

## 2021-02-22 DIAGNOSIS — M545 Low back pain, unspecified: Secondary | ICD-10-CM

## 2021-02-22 NOTE — Therapy (Signed)
[facility] [place] [address] [place], [place], [address] Phone: [phone]   Fax:  [phone]  Physical Therapy Treatment  Patient Details  Name: Brian Rasmussen MRN: [id number] Date of Birth: 07/04/1975 Referring Provider (PT): Dr Everrett Coombe   Encounter Date: 02/22/2021   PT End of Session - 02/22/21 1348     Visit Number 3    Number of Visits 12    Date for PT Re-Evaluation 03/29/21    PT Start Time 1345    PT Stop Time 1433    PT Time Calculation (min) 48 min    Activity Tolerance Patient tolerated treatment well             Past Medical History:  Diagnosis Date   Chronic headaches    Visual disturbances     Past Surgical History:  Procedure Laterality Date   TONSILLECTOMY      There were no vitals filed for this visit.   Subjective Assessment - 02/22/21 1348     Subjective Patient reports some improvement in the Lt shoulder and thoracic area. Played tennis Friday night 02/19/21 and did ok. Still a little bit of LBP but does not stop him from doing anything. LBP started from bending over to pick up a tennis ball 02/11/21.    Currently in Pain? Yes    Pain Score 2     Pain Location Thoracic    Pain Orientation Left    Pain Descriptors / Indicators Nagging    Pain Type Chronic pain                [facility] PT Assessment - 02/22/21 0001       Assessment   Medical Diagnosis Thoracic pain; LBP    Referring Provider (PT) Dr Everrett Coombe    Onset Date/Surgical Date 09/28/19   LBP flare up 02/11/21   Hand Dominance Right    Next MD Visit as needed    Prior Therapy chiropractic care; massage therapy      Palpation   Palpation comment muscular tightness through ant/lat/post cervical musculature; pecs; upper trap; Rt QL/piriformis                           [facility] Adult PT Treatment/Exercise - 02/22/21 0001       Lumbar Exercises: Stretches   Passive Hamstring Stretch  Right;Left;3 reps;20 seconds   supine x 2, seated x 1   Passive Hamstring Stretch Limitations supine with strap x 2 reps; sitting x 1rep each side    Piriformis Stretch Right;Left;2 reps;30 seconds    Other Lumbar Stretch Exercise supine trunk rotation with core engaged 20 sec x 3 reps each side      Lumbar Exercises: Standing   Scapular Retraction Strengthening;Both;10 reps;Theraband    Theraband Level (Scapular Retraction) Level 2 (Red)    Row Strengthening;Both;10 reps;Theraband    Theraband Level (Row) Level 4 (Blue)    Shoulder Extension Strengthening;Both;10 reps;Theraband    Theraband Level (Shoulder Extension) Level 4 (Blue)      Lumbar Exercises: Supine   AB Set Limitations 4 part core 10 sec x 5 reps      Lumbar Exercises: Prone   Opposite Arm/Leg Raise Right arm/Left leg;Left arm/Right leg;5 reps;2 seconds      Lumbar Exercises: Quadruped   Madcat/Old Horse 5 reps    Other Quadruped Lumbar Exercises childs pose 20 sec hold x 3 reps      Shoulder  Exercises: ROM/Strengthening   UBE (Upper Arm Bike) L3 x 2 min 1 min fwd/1 min back    X to V Arms V at wall x 10 reps    Other ROM/Strengthening Exercises T at wall 2 reps x 20 sec      Shoulder Exercises: Stretch   Other Shoulder Stretches 3 position doorway stretch x 30 sec x 2 reps each position, straight arm pec stretch x 30 sec, overhead doorway stretch x 15 sec      Manual Therapy   Manual therapy comments pt prone    Joint Mobilization lateral mobs thoracic spine Grade I/II    Soft tissue mobilization dep tissue work Lt > Rt cervical to thoracic paraspinals to Lt leveator scapulae    Myofascial Release thoracic paraspinals                     PT Education - 02/22/21 1406     Education Details HEP    Person(s) Educated Patient    Methods Explanation;Demonstration;Tactile cues;Verbal cues;Handout    Comprehension Verbalized understanding;Returned demonstration;Verbal cues required;Tactile cues required                  PT Long Term Goals - 02/15/21 1723       PT LONG TERM GOAL #1   Title Decrease pain through upper to mid thoracic spine to improve functional and recreational activities    Time 6    Period Weeks    Status New    Target Date 03/29/21      PT LONG TERM GOAL #2   Title Increase cervical ROM in all planes to WNL's and pain free    Time 6    Period Weeks    Status New    Target Date 03/29/21      PT LONG TERM GOAL #3   Title Improve upper and lower core stability and strength allowing patient to play 1-2 hours of tennis without increase in symptoms    Time 6    Period Weeks    Status New    Target Date 03/29/21      PT LONG TERM GOAL #4   Title Independent in HEP (including aquatic therapy as indicated)    Time 6    Period Weeks    Status New    Target Date 03/29/21      PT LONG TERM GOAL #5   Title Improve functional limitation score to 76    Time 6    Period Weeks    Status New    Target Date 03/29/21                   Plan - 02/22/21 1358     Clinical Impression Statement Continued discomfort and pain in the thoracic spine area on Lt. Added posterior shoulder girdle strengthening without difficulty. Palpable tightness in posterior cervical to thoracic area and into leveator on Lt. may benefit from trial of DN to this area.    Rehab Potential Good    PT Frequency 2x / week    PT Duration 6 weeks    PT Treatment/Interventions ADLs/Self Care Home Management;Aquatic Therapy;Cryotherapy;Electrical Stimulation;Iontophoresis 4mg /ml Dexamethasone;Moist Heat;Ultrasound;Therapeutic activities;Therapeutic exercise;Neuromuscular re-education;Patient/family education;Manual techniques;Dry needling;Taping    PT Next Visit Plan add posterior shoulder girdle exercises; progress with core stabilization/strengthening; continue spine care education; specific stretches for tennis    PT Home Exercise Plan [email or web address]    Consulted and Agree with Plan of Care  Patient  Patient will benefit from skilled therapeutic intervention in order to improve the following deficits and impairments:     Visit Diagnosis: Pain in thoracic spine  Acute bilateral low back pain without sciatica  Other symptoms and signs involving the musculoskeletal system  Abnormal posture  Muscle weakness (generalized)     Problem List Patient Active Problem List   Diagnosis Date Noted   Upper back pain on left side 02/09/2021   Daytime somnolence 02/09/2021   Well adult exam 10/12/2020   Ocular migraine 09/07/2020   Borderline high cholesterol 06/26/2017   Overweight (BMI 25.0-29.9) 06/23/2017   Metatarsalgia of right foot 06/23/2017   Bunion of great toe of right foot 06/23/2017   Elevated blood pressure reading 06/23/2017    Val Riles, PT MPH  02/22/2021, 2:34 PM  [facility] [address] [place], [place], [address] Phone: [phone]   Fax:  [phone]  Name: Brian Rasmussen MRN: [id number] Date of Birth: May 09, 1976

## 2021-02-22 NOTE — Patient Instructions (Addendum)
  Access Code: [email or web address] URL: [email or web address] Date: 02/22/2021 Prepared by: Corlis Leak  Exercises Prone Press Up - 2 x daily - 7 x weekly - 1 sets - 10 reps - 2-3 sec hold Prone Press Up On Elbows - 2 x daily - 7 x weekly - 1 sets - 3 reps - 30 sec hold Supine Piriformis Stretch with Leg Straight - 2 x daily - 7 x weekly - 1 sets - 3 reps - 30 sec hold Hooklying Hamstring Stretch with Strap - 2 x daily - 7 x weekly - 1 sets - 3 reps - 30 sec hold Supine Transversus Abdominis Bracing with Pelvic Floor Contraction - 2 x daily - 7 x weekly - 1 sets - 10 reps - 10sec hold Doorway Pec Stretch at 60 Degrees Abduction - 3 x daily - 7 x weekly - 3 reps - 1 sets Doorway Pec Stretch at 90 Degrees Abduction - 3 x daily - 7 x weekly - 3 reps - 1 sets - 30 seconds hold Doorway Pec Stretch at 120 Degrees Abduction - 3 x daily - 7 x weekly - 3 reps - 1 sets - 30 second hold hold Shoulder External Rotation and Scapular Retraction with Resistance - 2 x daily - 7 x weekly - 10 reps - 3 sets Scapular Retraction with Resistance - 2 x daily - 7 x weekly - 10 reps - 3 sets Scapular Retraction with Resistance Advanced - 2 x daily - 7 x weekly - 10 reps - 3 sets Cat Cow - 2 x daily - 7 x weekly - 1 sets - 5-10 reps - 3-5 sec hold Cat Cow to Child's Pose - 2 x daily - 7 x weekly - 1 sets - 3 reps - 30 sec hold

## 2021-02-23 ENCOUNTER — Encounter: Payer: Managed Care, Other (non HMO) | Admitting: Rehabilitative and Restorative Service Providers"

## 2021-02-26 ENCOUNTER — Encounter: Payer: Managed Care, Other (non HMO) | Admitting: Physical Therapy

## 2021-03-09 ENCOUNTER — Ambulatory Visit: Payer: Managed Care, Other (non HMO) | Admitting: Physical Therapy

## 2021-03-09 ENCOUNTER — Other Ambulatory Visit: Payer: Self-pay

## 2021-03-09 ENCOUNTER — Encounter: Payer: Self-pay | Admitting: Physical Therapy

## 2021-03-09 DIAGNOSIS — R293 Abnormal posture: Secondary | ICD-10-CM | POA: Diagnosis not present

## 2021-03-09 DIAGNOSIS — R29898 Other symptoms and signs involving the musculoskeletal system: Secondary | ICD-10-CM

## 2021-03-09 DIAGNOSIS — M6281 Muscle weakness (generalized): Secondary | ICD-10-CM | POA: Diagnosis not present

## 2021-03-09 DIAGNOSIS — M545 Low back pain, unspecified: Secondary | ICD-10-CM

## 2021-03-09 DIAGNOSIS — M546 Pain in thoracic spine: Secondary | ICD-10-CM | POA: Diagnosis not present

## 2021-03-09 NOTE — Therapy (Addendum)
[place] [place] [address] [place], [place], [address] Phone: [phone]   Fax:  [phone]  Physical Therapy Treatment and Discharge Summary  PHYSICAL THERAPY DISCHARGE SUMMARY  Visits from Start of Care: 4  Current functional level related to goals / functional outcomes: See progress note for discharge status    Remaining deficits: Continued intermittent symptoms but less intense/frequent   Education / Equipment: HEP    Patient agrees to discharge. Patient goals were partially met. Patient is being discharged due to not returning since the last visit.   Patient Details  Name: Brian Rasmussen MRN: [id number] Date of Birth: Feb 27, 1976 Referring Provider (PT): Dr Luetta Nutting   Encounter Date: 03/09/2021   PT End of Session - 03/09/21 0807     Visit Number 4    Number of Visits 12    Date for PT Re-Evaluation 03/29/21    PT Start Time 0802    PT Stop Time 0840    PT Time Calculation (min) 38 min    Activity Tolerance Patient tolerated treatment well    Behavior During Therapy [facility] for tasks assessed/performed             Past Medical History:  Diagnosis Date   Chronic headaches    Visual disturbances     Past Surgical History:  Procedure Laterality Date   TONSILLECTOMY      There were no vitals filed for this visit.   Subjective Assessment - 03/09/21 0807     Subjective Pt reports he may be over stretching with the prone press up as it irritated his back for a couple days.  He was able to play tennis without any pain.  "I just played through it".  He would like to address the tightness in his neck upper back.    Patient Stated Goals get rid of the tightness and pain in the upper back    Currently in Pain? No/denies   tightness.               [facility] PT Assessment - 03/09/21 0001       Assessment   Medical Diagnosis Thoracic pain; LBP    Referring Provider (PT) Dr Luetta Nutting     Onset Date/Surgical Date 09/28/19   LBP flare up 02/11/21   Hand Dominance Right    Next MD Visit as needed    Prior Therapy chiropractic care; massage therapy      AROM   Cervical Flexion 70    Cervical Extension 42    Cervical - Right Side Bend 38    Cervical - Left Side Bend 47    Cervical - Right Rotation 65    Cervical - Left Rotation 60    Lumbar Flexion able to touch toes                [facility] Adult PT Treatment/Exercise - 03/09/21 0001       Neck Exercises: Stretches   Upper Trapezius Stretch Right;Left;2 reps;10 seconds    Levator Stretch Right;Left;2 reps;10 seconds    Neck Stretch Limitations prone on elbows with cervical flexion to neutral and diagonals x 3 reps each .    Other Neck Stretches scalenes stretch with strap on top of shoulder x 10 sec each direction      Lumbar Exercises: Stretches   Prone on Elbows Stretch 1 rep;30 seconds    Press Ups 2 reps;5 reps      Lumbar Exercises: Quadruped   Madcat/Old  Horse 5 reps   and wag the tail.   Madcat/Old Horse Limitations cues to keep hips over knees, shoulders over hands    Other Quadruped Lumbar Exercises childs pose 10 sec hold      Shoulder Exercises: Standing   External Rotation Strengthening;Both;12 reps    Theraband Level (Shoulder External Rotation) Level 4 (Blue)    Extension Strengthening;12 reps    Theraband Level (Shoulder Extension) Level 4 (Blue)    Row Strengthening;Both;15 reps    Theraband Level (Shoulder Row) Level 4 (Blue)    Diagonals Strengthening;Both;12 reps    Theraband Level (Shoulder Diagonals) Level 4 (Blue)      Shoulder Exercises: ROM/Strengthening   UBE (Upper Arm Bike) L4: 1.5 min each direction      Shoulder Exercises: Stretch   Other Shoulder Stretches middle and high doorway stretch,      Manual Therapy   Soft tissue mobilization IASTM and STM to bilat traps, scalenes, rhomboids, cervical paraspinals to decrease fascial restrictions and improve mobility.                    PT Long Term Goals - 02/15/21 1723       PT LONG TERM GOAL #1   Title Decrease pain through upper to mid thoracic spine to improve functional and recreational activities    Time 6    Period Weeks    Status New    Target Date 03/29/21      PT LONG TERM GOAL #2   Title Increase cervical ROM in all planes to WNL's and pain free    Time 6    Period Weeks    Status New    Target Date 03/29/21      PT LONG TERM GOAL #3   Title Improve upper and lower core stability and strength allowing patient to play 1-2 hours of tennis without increase in symptoms    Time 6    Period Weeks    Status New    Target Date 03/29/21      PT LONG TERM GOAL #4   Title Independent in HEP (including aquatic therapy as indicated)    Time 6    Period Weeks    Status New    Target Date 03/29/21      PT LONG TERM GOAL #5   Title Improve functional limitation score to 76    Time 6    Period Weeks    Status New    Target Date 03/29/21                   Plan - 03/09/21 0844     Clinical Impression Statement Improved lumbar flexion/ hamstring flexibility; pt able to touch toes now with less pain.  Cervical ROM improved, however continue limitation with Lt lateral flexion and Rt rotation. Encouraged pt to stretch daily and complete band exercises every other day.  Progressing towards goals.    Rehab Potential Good    PT Frequency 2x / week    PT Duration 6 weeks    PT Treatment/Interventions ADLs/Self Care Home Management;Aquatic Therapy;Cryotherapy;Electrical Stimulation;Iontophoresis 29m/ml Dexamethasone;Moist Heat;Ultrasound;Therapeutic activities;Therapeutic exercise;Neuromuscular re-education;Patient/family education;Manual techniques;Dry needling;Taping    PT Next Visit Plan progress with core stabilization/strengthening; continue spine care education    PT Home Exercise Plan [id number]    Consulted and Agree with Plan of Care Patient             Patient will benefit  from skilled therapeutic intervention in order  to improve the following deficits and impairments:  Decreased range of motion, Increased fascial restricitons, Decreased activity tolerance, Pain, Hypomobility, Impaired flexibility, Improper body mechanics, Decreased mobility, Decreased strength, Postural dysfunction  Visit Diagnosis: Pain in thoracic spine  Acute bilateral low back pain without sciatica  Abnormal posture  Muscle weakness (generalized)  Other symptoms and signs involving the musculoskeletal system     Problem List Patient Active Problem List   Diagnosis Date Noted   Upper back pain on left side 02/09/2021   Daytime somnolence 02/09/2021   Well adult exam 10/12/2020   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 Helene Kelp PT, MPH 04/09/21 9:23 AM   [place], [place], [address] Phone: [phone]   Fax:  [phone]  Name: Brian Rasmussen MRN: [id number] Date of Birth: 04-18-1976

## 2021-03-12 ENCOUNTER — Encounter: Payer: Managed Care, Other (non HMO) | Admitting: Physical Therapy

## 2021-03-22 ENCOUNTER — Telehealth: Payer: Self-pay | Admitting: Neurology

## 2021-03-22 NOTE — Telephone Encounter (Signed)
Dr. Frances Furbish will be out this day. I rescheduled patient to 04/02/21 and left him a voice mail letting him know and asking him to call the office if this new appointment won't work for him.

## 2021-03-25 ENCOUNTER — Institutional Professional Consult (permissible substitution): Payer: Managed Care, Other (non HMO) | Admitting: Neurology

## 2021-04-02 ENCOUNTER — Institutional Professional Consult (permissible substitution): Payer: Self-pay | Admitting: Neurology

## 2021-04-29 ENCOUNTER — Institutional Professional Consult (permissible substitution): Payer: Self-pay | Admitting: Neurology

## 2021-05-05 ENCOUNTER — Encounter: Payer: Self-pay | Admitting: Neurology

## 2021-05-05 ENCOUNTER — Ambulatory Visit: Payer: Managed Care, Other (non HMO) | Admitting: Neurology

## 2021-05-05 VITALS — BP 151/80 | HR 64 | Ht 69.0 in | Wt 210.5 lb

## 2021-05-05 DIAGNOSIS — F518 Other sleep disorders not due to a substance or known physiological condition: Secondary | ICD-10-CM | POA: Diagnosis not present

## 2021-05-05 DIAGNOSIS — G4719 Other hypersomnia: Secondary | ICD-10-CM | POA: Insufficient documentation

## 2021-05-05 DIAGNOSIS — G43909 Migraine, unspecified, not intractable, without status migrainosus: Secondary | ICD-10-CM | POA: Diagnosis not present

## 2021-05-05 NOTE — Patient Instructions (Signed)
Hypersomnia °Hypersomnia is a condition in which a person feels very tired during the day even though he or she gets plenty of sleep at night. A person with this condition may take naps during the day and may find it very difficult to wake up from sleep. Hypersomnia may affect a person's ability to think, concentrate, drive, or remember things. °What are the causes? °The cause of this condition may not be known. Possible causes include: °Certain medicines. °Sleep disorders, such as narcolepsy and sleep apnea. °Injury to the head, brain, or spinal cord. °Drug or alcohol use. °Gastroesophageal reflux disease (GERD). °Tumors. °Certain medical conditions, such as depression, diabetes, or an underactive thyroid gland (hypothyroidism). °What are the signs or symptoms? °The main symptoms of hypersomnia include: °Feeling very tired throughout the day, regardless of how much sleep you got the night before. °Having trouble waking up. Others may find it difficult to wake you up when you are sleeping. °Sleeping for longer and longer periods at a time. °Taking naps throughout the day. °Other symptoms may include: °Feeling restless, anxious, or annoyed. °Lacking energy. °Having trouble with: °Remembering. °Speaking. °Thinking. °Loss of appetite. °Seeing, hearing, tasting, smelling, or feeling things that are not real (hallucinations). °How is this diagnosed? °This condition may be diagnosed based on: °Your symptoms and medical history. °Your sleeping habits. Your health care provider may ask you to write down your sleeping habits in a daily sleep log, along with any symptoms you have. °A series of tests that are done while you sleep (sleep study or polysomnogram). °A test that measures how quickly you can fall asleep during the day (daytime nap study or multiple sleep latency test). °How is this treated? °Treatment can help you manage your condition. Treatment may include: °Following a regular sleep routine. °Lifestyle changes,  such as changing your eating habits, getting regular exercise, and avoiding alcohol or caffeinated beverages. °Taking medicines to make you more alert (stimulants) during the day. °Treating any underlying medical causes of hypersomnia. °Follow these instructions at home: °Sleep routine ° °Schedule the same bedtime and wake-up time each day. °Practice a relaxing bedtime routine. This may include reading, meditation, deep breathing, or taking a warm bath before going to sleep. °Get regular exercise each day. Avoid strenuous exercise in the evening hours. °Keep your sleep environment at a cooler temperature, darkened, and quiet. °Sleep with pillows and a mattress that are comfortable and supportive. °Schedule short 20-minute naps for when you feel sleepiest during the day. °Talk with your employer or teachers about your hypersomnia. If possible, adjust your schedule so that: °You have a regular daytime work schedule. °You can take a scheduled nap during the day. °You do not have to work or be active at night. °Do not eat a heavy meal for a few hours before bedtime. Eat your meals at about the same times every day. °Avoid drinking alcohol or caffeinated beverages. °Safety ° °Do not drive or use heavy machinery if you are sleepy. Ask your health care provider if it is safe for you to drive. °Wear a life jacket when swimming or spending time near water. °General instructions °Take supplements and over-the-counter and prescription medicines only as told by your health care provider. °Keep a sleep log that will help your doctor manage your condition. This may include information about: °What time you go to bed each night. °How often you wake up at night. °How many hours you sleep at night. °How often and for how long you nap during the day. °  Any observations from others, such as leg movements during sleep, sleep walking, or snoring. °Keep all follow-up visits as told by your health care provider. This is important. °Contact  a health care provider if: °You have new symptoms. °Your symptoms get worse. °Get help right away if: °You have serious thoughts about hurting yourself or someone else. °If you ever feel like you may hurt yourself or others, or have thoughts about taking your own life, get help right away. You can go to your nearest emergency department or call: °Your local emergency services (911 in the [place] °A suicide crisis helpline, such as the National Suicide Prevention Lifeline at 1-800-273-8255 or 988 in the [place] This is open 24 hours a day. °Summary °Hypersomnia refers to a condition in which you feel very tired during the day even though you get plenty of sleep at night. °A person with this condition may take naps during the day and may find it very difficult to wake up from sleep. °Hypersomnia may affect a person's ability to think, concentrate, drive, or remember things. °Treatment, such as following a regular sleep routine and making some lifestyle changes, can help you manage your condition. °This information is not intended to replace advice given to you by your health care provider. Make sure you discuss any questions you have with your health care provider. °Document Revised: 12/09/2020 Document Reviewed: 03/26/2020 °Elsevier Patient Education © 2022 [employer] °Quality Sleep Information, Adult °Quality sleep is important for your mental and physical health. It also improves your quality of life. Quality sleep means you: °Are asleep for most of the time you are in bed. °Fall asleep within 30 minutes. °Wake up no more than once a night.  °Are awake for no longer than 20 minutes if you do wake up during the night. °Most adults need 7-8 hours of quality sleep each night. °How can poor sleep affect me? °If you do not get enough quality sleep, you may have: °Mood swings. °Daytime sleepiness. °Confusion. °Decreased reaction time. °Sleep disorders, such as insomnia and sleep apnea. °Difficulty with: °Solving  problems. °Coping with stress. °Paying attention. °These issues may affect your performance and productivity at work, school, and at home. Lack of sleep may also put you at higher risk for accidents, suicide, and risky behaviors. °If you do not get quality sleep you may also be at higher risk for several health problems, including: °Infections. °Type 2 diabetes. °Heart disease. °High blood pressure. °Obesity. °Worsening of long-term conditions, like arthritis, kidney disease, depression, Parkinson's disease, and epilepsy. °What actions can I take to get more quality sleep? °  °Stick to a sleep schedule. Go to sleep and wake up at about the same time each day. Do not try to sleep less on weekdays and make up for lost sleep on weekends. This does not work. °Try to get about 30 minutes of exercise on most days. Do not exercise 2-3 hours before going to bed. °Limit naps during the day to 30 minutes or less. °Do not use any products that contain nicotine or tobacco, such as cigarettes or e-cigarettes. If you need help quitting, ask your health care provider. °Do not drink caffeinated beverages for at least 8 hours before going to bed. Coffee, tea, and some sodas contain caffeine. °Do not drink alcohol close to bedtime. °Do not eat large meals close to bedtime. °Do not take naps in the late afternoon. °Try to get at least 30 minutes of sunlight every day. Morning sunlight is best. °Make   time to relax before bed. Reading, listening to music, or taking a hot bath promotes quality sleep. °Make your bedroom a place that promotes quality sleep. Keep your bedroom dark, quiet, and at a comfortable room temperature. Make sure your bed is comfortable. Take out sleep distractions like TV, a computer, smartphone, and bright lights. °If you are lying awake in bed for longer than 20 minutes, get up and do a relaxing activity until you feel sleepy. °Work with your health care provider to treat medical conditions that may affect  sleeping, such as: °Nasal obstruction. °Snoring. °Sleep apnea and other sleep disorders. °Talk to your health care provider if you think any of your prescription medicines may cause you to have difficulty falling or staying asleep. °If you have sleep problems, talk with a sleep consultant. If you think you have a sleep disorder, talk with your health care provider about getting evaluated by a specialist. °Where to find more information °National Sleep Foundation website: https://sleepfoundation.org °National Heart, Lung, and Blood Institute (NHLBI): www.nhlbi.nih.gov/files/docs/public/sleep/healthy_sleep.pdf °Centers for Disease Control and Prevention (CDC): www.cdc.gov/sleep/index.html °Contact a health care provider if you: °Have trouble getting to sleep or staying asleep. °Often wake up very early in the morning and cannot get back to sleep. °Have daytime sleepiness. °Have daytime sleep attacks of suddenly falling asleep and sudden muscle weakness (narcolepsy). °Have a tingling sensation in your legs with a strong urge to move your legs (restless legs syndrome). °Stop breathing briefly during sleep (sleep apnea). °Think you have a sleep disorder or are taking a medicine that is affecting your quality of sleep. °Summary °Most adults need 7-8 hours of quality sleep each night. °Getting enough quality sleep is an important part of health and well-being. °Make your bedroom a place that promotes quality sleep and avoid things that may cause you to have poor sleep, such as alcohol, caffeine, smoking, and large meals. °Talk to your health care provider if you have trouble falling asleep or staying asleep. °This information is not intended to replace advice given to you by your health care provider. Make sure you discuss any questions you have with your health care provider. °Document Revised: 08/23/2017 Document Reviewed: 08/23/2017 °Elsevier Patient Education © 2022 [employer] ° °

## 2021-05-05 NOTE — Progress Notes (Signed)
SLEEP MEDICINE CLINIC    Provider:  Larey Seat, MD  Primary Care Physician:  Luetta Nutting, [address] [place] [address]     Referring Provider: Luetta Nutting, [address] [place],  [place] [address]          Chief Complaint according to patient   Patient presents with:     New Patient (Initial Visit)           HISTORY OF PRESENT ILLNESS:  Brian Rasmussen is a 45 y.o. Caucasian male patient and seen in Consultation  on 05/05/2021 from PCP for excessive daytime sleepiness. Chief concern according to patient :  " I have never been a great sleeper, and I fall asleep in a movie theater, and yet, when I want to nap, I may not be able to ". 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 Photophobia is present. I also sit in front of a computer, screen light can bother me". I have vivid dreams now. I dream all night. I feel as if I am not sleeping  yet I have slept ".    Marcin Holte  has a past medical history of Chronic headaches and Visual disturbances.      Sleep relevant medical history:  Parasomnia , Tonsillectomy, allergic hay fever, ear tubes, sinusitis.    Family medical /sleep history:  PGM had insomnia, father has OSA but is not using CPAP, sister has Insomnia.  Social history:  Patient is working as a Management consultant- and lives in a household with [place],  adult daughter from a former marriage.  The patient currently works days.  One dog is present. Tobacco use none .  ETOH use; 3 a week-, Caffeine intake in form of Coffee( 1 a day) Soda( 1 a day) Tea ( hot tea at night-) or energy drinks. Regular exercise in form of Tennis..   Hobbies :Tennis      Sleep habits are as follows: The patient's dinner time is between 7-8 PM. The patient goes to bed at 12 PM and leaves the TV on a timer- he continues to sleep for 6 hours, wakes for one bathroom breaks, the first time at 5-6 AM.   The  preferred sleep position is sideways, with the support of 1-2 pillows. Dreams are reportedly frequent/vivid. Some dreams about fighting, defending, wrestling.  6  AM is the usual rise time. The patient wakes up with an alarm. He struggles to get up.  He reports not feeling refreshed or restored in AM, with symptoms such as dry mouth, rare morning headaches, and residual fatigue.  Naps are taken infrequently, usually while watching TV,  lasting from 20 to 30 minutes and are less refreshing than nocturnal sleep.    Review of Systems: Out of a complete 14 system review, the patient complains of only the following symptoms, and all other reviewed systems are negative.:  Fatigue, sleepiness ,  no snoring,    How likely are you to doze in the following situations: 0 = not likely, 1 = slight chance, 2 = moderate chance, 3 = high chance   Sitting and Reading? Watching Television? Sitting inactive in a public place (theater or meeting)? Theater yes, meeting , no  As a passenger in a car for an hour without a break? Lying down in the afternoon when circumstances permit? Sitting and talking to someone? Sitting quietly after lunch without alcohol? In  a car, while stopped for a few minutes in traffic?   Total = 15/ 24 points   FSS endorsed at n/a -/ 63 points.   Social History   Socioeconomic History   Marital status: Significant Other    Spouse name: Not on file   Number of children: 1   Years of education: 15   Highest education level: Not on file  Occupational History   Occupation: Chartered certified accountant  Tobacco Use   Smoking status: Never   Smokeless tobacco: Never  Vaping Use   Vaping Use: Never used  Substance and Sexual Activity   Alcohol use: Yes    Alcohol/week: 3.0 - 4.0 standard drinks    Types: 3 - 4 Standard drinks or equivalent per week    Comment: few drinks per week   Drug use: No   Sexual activity: Yes    Partners: Male    Birth control/protection: Post-menopausal   Other Topics Concern   Not on file  Social History Narrative   Right handed   Caffeine use: 28 oz coffee per day, coke sometimes    Social Determinants of Radio broadcast assistant Strain: Not on file  Food Insecurity: Not on file  Transportation Needs: Not on file  Physical Activity: Not on file  Stress: Not on file  Social Connections: Not on file    Family History  Problem Relation Age of Onset   Hyperlipidemia Other    Hypertension Other    Heart attack Father    Cancer Paternal Grandmother     Past Medical History:  Diagnosis Date   Chronic headaches    Visual disturbances     Past Surgical History:  Procedure Laterality Date   TONSILLECTOMY       Current Outpatient Medications on File Prior to Visit  Medication Sig Dispense Refill   Cetirizine HCl (ZYRTEC PO) Take by mouth.     ibuprofen (ADVIL) 200 MG tablet Take 200 mg by mouth every 6 (six) hours as needed.     Multiple Vitamins-Minerals (MULTIVITAMIN PO) Take by mouth.     No current facility-administered medications on file prior to visit.    No Known Allergies  Physical exam:  Today's Vitals   05/05/21 0848  BP: (!) 151/80  Pulse: 64  SpO2: 98%  Weight: 210 lb 8 oz (95.5 kg)  Height: _0  (1.753 m)   Body mass index is 31.09 kg/m.   Wt Readings from Last 3 Encounters:  05/05/21 210 lb 8 oz (95.5 kg)  02/09/21 207 lb (93.9 kg)  10/12/20 205 lb (93 kg)     Ht Readings from Last 3 Encounters:  05/05/21 _1  (1.753 m)  02/09/21 _2  (1.753 m)  10/12/20 _3  (1.753 m)      General: The patient is awake, alert and appears not in acute distress. The patient is well groomed. Head: Normocephalic, atraumatic. Neck is supple. Mallampati ONE,  neck circumference:15.5 inches . Nasal airflow  patent.  Retrognathia is seen.  Dental status:  grinding- clenching.  Cardiovascular:  Regular rate and cardiac rhythm by pulse,  without distended neck veins. Respiratory: Lungs are clear to  auscultation.  Skin:  Without evidence of ankle edema, or rash. Trunk: The patient's posture is erect.   Neurologic exam : The patient is awake and alert, oriented to place and time.   Memory subjective described as intact.  Attention span & concentration ability appears normal.  Speech is fluent,  without  dysarthria, dysphonia  or aphasia.  Mood and affect are appropriate.   Cranial nerves: no loss of smell or taste reported  Pupils are equal and briskly reactive to light. Funduscopic exam deferred. .  Extraocular movements in vertical and horizontal planes were intact and without nystagmus. No Diplopia. Visual fields by finger perimetry are intact. Hearing was intact to soft voice and finger rubbing.    Facial sensation intact to fine touch.  Facial motor strength is symmetric and tongue and uvula move midline.  Neck ROM : rotation, tilt and flexion extension were normal for age and shoulder shrug was symmetrical.    Motor exam:  Symmetric bulk, tone and ROM.   Normal tone without cog- wheeling, symmetric grip strength .   Sensory:  Fine touch and vibration were  normal.  Proprioception tested in the upper extremities was normal.   Coordination: Rapid alternating movements in the fingers/hands were of normal speed.  The Finger-to-nose maneuver was intact without evidence of ataxia, dysmetria or tremor.   Gait and station: Patient could rise unassisted from a seated position, walked without assistive device.  Stance is of normal width/ base and the patient turned with 3 steps.  Toe and heel walk were deferred.  Deep tendon reflexes: in the  upper and lower extremities are symmetric and intact.  Babinski response was deferred.      After spending a total time of 45 minutes face to face and additional time for physical and neurologic examination, review of laboratory studies,  personal review of imaging studies, reports and results of other testing and review of referral  information / records as far as provided in visit, I have established the following assessments:  1) I have the pleasure of meeting with Mr. Darrall " Gerald Stabs " Blanding, who reports that he has always been a person feeling somewhat fatigued and sleepier than others.  Sleep has really been restorative and refreshing and over the last couple of years getting up in the morning has been not as easy for him.  He has never been observed to snore or have apnea and his upper airway is wide open.  He does have a BMI of 31 but he also has a more muscular build.  What is remarkable is that the patient reports more vivid dreams almost every night for several times at night that he will dream and he has been excessively daytime sleepy.  My first step will be to rule out apnea as a contributor but I do think that we are facing another sleep disorder here the patient also on opiates only about 6 hours of sleep time to himself and that may not be enough anymore.  So advancing his bedtime by an hour may improve the overall sleepiness degree.    Plan  I will order a home sleep test in order to screen for sleep apnea I will also order an HLA test Lymphocytic antigen test for the genetic markers of narcolepsy.  Frequent vivid dreams, waking up directly out of the dream activity is a common feature and narcolepsy.  The patient's daughter has a clinical history of sleep paralysis but he personally does not.  There is no cataplexy.   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     My Plan is to proceed with:  1) HST 2) HLA  3) Consider modafinil for excessive daytime sleepiness.  4) sleep hygiene.   I would like to thank Luetta Nutting, DO and Luetta Nutting, Do [address]  [place],  [place] [address] for allowing me to meet with and to take care of this pleasant patient.   I plan to follow up either personally or through our NP within 3-4 month.   CC: I will share my notes  with .  Electronically signed by: Larey Seat, MD 05/05/2021 9:04 AM  [facility] Board certified by The [employer] of Sleep Medicine and Diplomate of the [employer] of Sleep Medicine. Board certified In Neurology through the [place], Fellow of the [employer] of Neurology. Medical Director of [facility].

## 2021-05-14 ENCOUNTER — Encounter: Payer: Self-pay | Admitting: Neurology

## 2021-05-14 LAB — NARCOLEPSY EVALUATION
DQA1*01:02: POSITIVE
DQB1*06:02: NEGATIVE

## 2021-05-14 NOTE — Progress Notes (Signed)
One allel positive for narcolepsy trait.

## 2021-05-17 ENCOUNTER — Encounter: Payer: Self-pay | Admitting: Neurology

## 2021-05-17 NOTE — Telephone Encounter (Signed)
There are many different HLA tests for autoimmune conditions - this is specific for narcolepsy.   Narcolepsy is a  chronic sleep disorder that causes overwhelming daytime drowsiness. The cause of narcolepsy isn't well understood but can involve genetic factors and abnormal signaling in the brain ( mediated by orexin/ hypocretin). Narcolepsy causes attacks of sleep., some sudden, some more underlying fatigue-   Sudden loss of muscle tone ( cataplexy)  and hallucinations might occur ( hypnagogic/ hypnopompic)  Stimulants can help sleepiness, antidepressants suppress REM sleep, and other medications Gust Brooms, XYWAV, WAKIX, SUNOSI) can help. Rare Fewer than 200,000 [place] cases per year Treatment can reduce symptoms.  Diagnosis : Requires a medical diagnosis by  Sleep Lab tests ( PSG and MSLT )  or CSF testing.

## 2021-06-02 ENCOUNTER — Telehealth: Payer: Self-pay

## 2021-06-02 NOTE — Telephone Encounter (Signed)
Pt lvm stating he needs a Biometric form completed by Dr. Zigmund Daniel.   Front Desk: Please contact patient for form drop-off, MyChart attachment, or fax. Dr. Zigmund Daniel is completing forms tomorrow so it would be great if he has this to [place] by AM.   Thanks

## 2021-06-03 NOTE — Telephone Encounter (Signed)
LVM for patient to drop off form so Dr Ashley Royalty can get this completed. AM

## 2021-06-09 ENCOUNTER — Ambulatory Visit: Payer: Managed Care, Other (non HMO) | Admitting: Neurology

## 2021-06-09 DIAGNOSIS — G4710 Hypersomnia, unspecified: Secondary | ICD-10-CM | POA: Diagnosis not present

## 2021-06-09 DIAGNOSIS — G4719 Other hypersomnia: Secondary | ICD-10-CM

## 2021-06-09 DIAGNOSIS — G43909 Migraine, unspecified, not intractable, without status migrainosus: Secondary | ICD-10-CM

## 2021-06-09 DIAGNOSIS — F518 Other sleep disorders not due to a substance or known physiological condition: Secondary | ICD-10-CM

## 2021-06-14 NOTE — Progress Notes (Signed)
[facility] TEST REPORT ( by Watch PAT)   STUDY DATE:  06-10-2021 DOB:  1976-05-11   ORDERING CLINICIAN: Larey Seat, MD  REFERRING CLINICIAN:    CLINICAL INFORMATION/HISTORY: Brian Rasmussen is a 46 y.o. Caucasian male patient and seen in Consultation on 05/05/2021 from PCP Dr Zigmund Daniel, DO , for excessive daytime sleepiness. Chief concern :  " I have never been a great sleeper, and I fall asleep in a movie theater, and yet, when I want to nap, I may not be able to ". 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 Photophobia is present. I also sit in front of a computer, screen light can bother me". "I have vivid dreams now. I dream all night. I feel as if I am not sleeping  yet I have slept ".   Brian Rasmussen has a past medical history of Chronic headaches and Visual disturbances.       Epworth sleepiness score: 15/24. FSS at n/a   BMI: 31 kg/m   Neck Circumference: 16"   FINDINGS:   Sleep Summary:   Total Recording Time (hours, min): The total recording time for this patient's home sleep test amounted to 7 hours and 27 minutes of which 6 hours and 50% 53 minutes was a total recorded sleep time.  REM sleep time was 19.4% .                               Respiratory Indices:   Calculated pAHI (per hour): 2.4/h 2.4/h,  accentuated slightly and REM sleep to 3/h ,  in non-REM sleep sleep 2.2/h.                                                Supine AHI: 1.5/h 5/h.  The AHI AHI for nonsupine sleep time sleep was 9 point was 9.0/h mainly due to an elevated and elevated apnea and elevated apnea index during prone prone sleep.  The patient only slept only slept 17 minutes in prone sleep however prone sleep however.  Snoring  Snoring levels were average for average only 18% of total sleep time were accompanied by snoring and the mean volume was 40 dB which is mild -moderate.                                                 Oxygen Saturation  Statistics:  O2 Saturation Range (%): Oxygen saturation varied between a nadir of 91% and a maximum of 98% with a mean saturation of 94%.                                     O2 Saturation (minutes) <89%: 0 minutes         Pulse Rate Statistics:   Pulse Mean (bpm):   61 bpm              Pulse Range:   Between 43 and 90 bpm.              IMPRESSION:  This HST did  not capture sleep apnea , nor hypoxia.  RECOMMENDATION: The graphic impression of sleep architecture shows REM sleep to dominate the first half of sleep which is unusual.   However,  sleep was not very fragmented the patient actually had very few arousals from sleep and there was a fair amount of deep sleep present.  I am unable to find a physiologic sleep disorder by screening versus home sleep test.  I would recommend to reduce caffeine intake,  Vivid dreams are present , and we should consider evaluation of possible narcolepsy.  There was no indication of cataplexy, sleep paralysis -but reported were frequent vivid dreams. HLA test had been ordered.       INTERPRETING PHYSICIAN:   Larey Seat, MD   Medical Director of [facility] at [facility].

## 2021-06-15 ENCOUNTER — Telehealth: Payer: Self-pay | Admitting: Family Medicine

## 2021-06-15 NOTE — Telephone Encounter (Signed)
Patient came in to office and dropped off a Biometric Work Form. Patient stated that his height is listed incorrectly. Patient stated that his height should be listed as 5 feet 11.5 inches. Paperwork was placed in providers box. Patient was informed of a possible fee and 3-5 day turn around. -lmr

## 2021-06-15 NOTE — Telephone Encounter (Signed)
We have his height at 5 feet 9 inches.  He'll need to stop back in for nurse visit to have this re-measured.

## 2021-06-15 NOTE — Telephone Encounter (Signed)
Voice mail left for patient to call and schedule a nurse visit regarding notation of height. - lmr

## 2021-06-16 ENCOUNTER — Ambulatory Visit (INDEPENDENT_AMBULATORY_CARE_PROVIDER_SITE_OTHER): Payer: Managed Care, Other (non HMO) | Admitting: Family Medicine

## 2021-06-16 ENCOUNTER — Other Ambulatory Visit: Payer: Self-pay

## 2021-06-16 VITALS — Ht 70.0 in

## 2021-06-16 DIAGNOSIS — Z0000 Encounter for general adult medical examination without abnormal findings: Secondary | ICD-10-CM

## 2021-06-16 NOTE — Progress Notes (Signed)
Medical screening examination/treatment was performed by qualified clinical staff member and as supervising physician I was immediately available for consultation/collaboration. I have reviewed documentation and agree with assessment and plan.  Loeta Herst, DO  

## 2021-06-16 NOTE — Progress Notes (Signed)
Pt here for height check only for biometric screening form.  Tiajuana Amass, CMA

## 2021-06-21 ENCOUNTER — Encounter: Payer: Self-pay | Admitting: Neurology

## 2021-06-22 NOTE — Procedures (Signed)
[facility] TEST REPORT ( by Watch PAT)   STUDY DATE:  06-10-2021 DOB:  09-28-1975   ORDERING CLINICIAN: Larey Seat, MD  REFERRING CLINICIAN:    CLINICAL INFORMATION/HISTORY: [name] is a 46 y.o. Caucasian male patient and seen in Consultation on 05/05/2021 from PCP Dr Zigmund Daniel, DO , for excessive daytime sleepiness. Chief concern :  " I have never been a great sleeper, and I fall asleep in a movie theater, and yet, when I want to nap, I may not be able to ". 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 Photophobia is present. I also sit in front of a computer, screen light can bother me". "I have vivid dreams now. I dream all night. I feel as if I am not sleeping  yet I have slept ".   Orry Sigl has a past medical history of Chronic headaches and Visual disturbances.       Epworth sleepiness score: 15/24. FSS at n/a   BMI: 31 kg/m   Neck Circumference: 16"   FINDINGS:   Sleep Summary:   Total Recording Time (hours, min): The total recording time for this patient's home sleep test amounted to 7 hours and 27 minutes of which 6 hours and 50% 53 minutes was a total recorded sleep time.  REM sleep time was 19.4% .                               Respiratory Indices:   Calculated pAHI (per hour): 2.4/h 2.4/h,  accentuated slightly and REM sleep to 3/h ,  in non-REM sleep sleep 2.2/h.                                                Supine AHI: 1.5/h 5/h.  The AHI AHI for nonsupine sleep time sleep was 9 point was 9.0/h mainly due to an elevated and elevated apnea and elevated apnea index during prone prone sleep.  The patient only slept only slept 17 minutes in prone sleep however prone sleep however.  Snoring  Snoring levels were average for average only 18% of total sleep time were accompanied by snoring and the mean volume was 40 dB which is mild -moderate.                                                 Oxygen Saturation  Statistics:  O2 Saturation Range (%): Oxygen saturation varied between a nadir of 91% and a maximum of 98% with a mean saturation of 94%.                                     O2 Saturation (minutes) <89%: 0 minutes         Pulse Rate Statistics:   Pulse Mean (bpm):   61 bpm              Pulse Range:   Between 43 and 90 bpm.              IMPRESSION:  This HST did not capture sleep apnea , nor  hypoxia.  RECOMMENDATION: The graphic impression of sleep architecture shows REM sleep to dominate the first half of sleep which is unusual.   However,  sleep was not very fragmented the patient actually had very few arousals from sleep and there was a fair amount of deep sleep present.  I am unable to find a physiologic sleep disorder by screening versus home sleep test.  I would recommend to reduce caffeine intake,  Vivid dreams are present , and we should consider evaluation of possible narcolepsy.  There was no indication of cataplexy, sleep paralysis -but reported were frequent vivid dreams. HLA test had been ordered.       INTERPRETING PHYSICIAN:   Larey Seat, MD   Medical Director of [facility] at [facility].

## 2021-06-22 NOTE — Progress Notes (Signed)
No explanation for vivid dreams and hypersomnia. HST did not show apnea.  Consider narcolepsy work up, patient should reduce caffeine intake.

## 2021-06-23 ENCOUNTER — Telehealth: Payer: Self-pay | Admitting: Neurology

## 2021-06-23 NOTE — Telephone Encounter (Signed)
-----   Message from Melvyn Novas, MD sent at 06/22/2021  2:21 PM EST ----- No explanation for vivid dreams and hypersomnia. HST did not show apnea.  Consider narcolepsy work up, patient should reduce caffeine intake.

## 2021-06-23 NOTE — Telephone Encounter (Signed)
Called the pt and reviewed the lab results. There was no sign of sleep apnea present, low oxygen or heart rate concerns. Advised that we could complete a narcolepsy test to rule out if narcolepsy is an issue. I reviewed what that would look like. Pt would like to think about this and get back with [place] on moving forward. Pt verbalized understanding of the results.

## 2022-07-06 ENCOUNTER — Encounter: Payer: Self-pay | Admitting: Family Medicine

## 2022-07-06 ENCOUNTER — Ambulatory Visit: Payer: Managed Care, Other (non HMO) | Admitting: Family Medicine

## 2022-07-06 VITALS — BP 139/80 | HR 70 | Ht 72.0 in | Wt 210.1 lb

## 2022-07-06 DIAGNOSIS — Z0000 Encounter for general adult medical examination without abnormal findings: Secondary | ICD-10-CM | POA: Diagnosis not present

## 2022-07-06 DIAGNOSIS — G4719 Other hypersomnia: Secondary | ICD-10-CM

## 2022-07-06 DIAGNOSIS — G43109 Migraine with aura, not intractable, without status migrainosus: Secondary | ICD-10-CM

## 2022-07-06 DIAGNOSIS — E789 Disorder of lipoprotein metabolism, unspecified: Secondary | ICD-10-CM | POA: Diagnosis not present

## 2022-07-06 DIAGNOSIS — H539 Unspecified visual disturbance: Secondary | ICD-10-CM

## 2022-07-06 LAB — CBC WITH DIFFERENTIAL/PLATELET
Absolute Monocytes: 392 cells/uL (ref 200–950)
Basophils Absolute: 18 cells/uL (ref 0–200)
Basophils Relative: 0.4 %
MCHC: 34.5 g/dL (ref 32.0–36.0)
Monocytes Relative: 8.7 %
Neutrophils Relative %: 58.6 %
Platelets: 264 10*3/uL (ref 140–400)
RBC: 4.9 10*6/uL (ref 4.20–5.80)
RDW: 12.2 % (ref 11.0–15.0)
WBC: 4.5 10*3/uL (ref 3.8–10.8)

## 2022-07-06 NOTE — Assessment & Plan Note (Signed)
Continues to have episodes of ocular migraine.  He also has some episodes of visual disturbance that seem separate from his typical migraine.  Referral to ophthalmology.  Discussed trial of topiramate, he would like to hold off on this.  Recommend trial of magnesium supplement as this may be helpful for migraine.

## 2022-07-06 NOTE — Progress Notes (Signed)
Brian Rasmussen - 47 y.o. male MRN [id number]  Date of birth: [birth year]  Subjective Chief Complaint  Patient presents with   Migraine    HPI Brian Rasmussen is a 47 y.o. male here today with complaint of migraines.  History of migraines with ocular features.  Has seen eye doctor in the past and had MRI.  Reports that he has had a handful of migraines over the past few months.  These affect his vision at times when driving and/or playing tennis.  He has used ibuprofen as needed for these. Additionally, he is having some intermittent episodes where his vision seems changed, almost like he is "cross eyed."   Previously did not want medication as these were not occurring that frequently.  Still hesitant about medication.  He does have an optometry appt upcoming.   ROS:  A comprehensive ROS was completed and negative except as noted per HPI  No Known Allergies  Past Medical History:  Diagnosis Date   Chronic headaches    Visual disturbances     Past Surgical History:  Procedure Laterality Date   TONSILLECTOMY      Social History   Socioeconomic History   Marital status: Significant Other    Spouse name: Not on file   Number of children: 1   Years of education: 15   Highest education level: Not on file  Occupational History   Occupation: Chartered certified accountant  Tobacco Use   Smoking status: Never   Smokeless tobacco: Never  Vaping Use   Vaping Use: Never used  Substance and Sexual Activity   Alcohol use: Yes    Alcohol/week: 3.0 - 4.0 standard drinks of alcohol    Types: 3 - 4 Standard drinks or equivalent per week    Comment: few drinks per week   Drug use: No   Sexual activity: Yes    Partners: Male    Birth control/protection: Post-menopausal  Other Topics Concern   Not on file  Social History Narrative   Right handed   Caffeine use: 28 oz coffee per day, coke sometimes    Social Determinants of Health   Financial Resource Strain: Not on file  Food Insecurity:  Not on file  Transportation Needs: Not on file  Physical Activity: Not on file  Stress: Not on file  Social Connections: Not on file    Family History  Problem Relation Age of Onset   Hyperlipidemia Other    Hypertension Other    Heart attack Father    Cancer Paternal Grandmother     Health Maintenance  Topic Date Due   COLONOSCOPY (Pts 45-9yrs Insurance coverage will need to be confirmed)  07/07/2023 (Originally 02/06/2019)   Hepatitis C Screening  07/07/2023 (Originally 02/06/1992)   COVID-19 Vaccine (3 - 2023-24 season) 07/23/2023 (Originally 01/28/2022)   INFLUENZA VACCINE  08/28/2023 (Originally 12/28/2021)   DTaP/Tdap/Td (2 - Td or Tdap) 11/06/2023   HPV VACCINES  Aged Out     ----------------------------------------------------------------------------------------------------------------------------------------------------------------------------------------------------------------- Physical Exam BP 139/80 (BP Location: Left Arm, Patient Position: Sitting, Cuff Size: Normal)   Pulse 70   Ht 6' (1.829 m)   Wt 210 lb 1.6 oz (95.3 kg)   SpO2 100%   BMI 28.49 kg/m   Physical Exam Constitutional:      Appearance: Normal appearance.  HENT:     Head: Normocephalic and atraumatic.  Eyes:     General: No scleral icterus.    Extraocular Movements: Extraocular movements intact.     Pupils: Pupils are equal, round,  and reactive to light.  Neurological:     Mental Status: He is alert.  Psychiatric:        Mood and Affect: Mood normal.        Behavior: Behavior normal.     ------------------------------------------------------------------------------------------------------------------------------------------------------------------------------------------------------------------- Assessment and Plan  Ocular migraine Continues to have episodes of ocular migraine.  He also has some episodes of visual disturbance that seem separate from his typical migraine.  Referral to  ophthalmology.  Discussed trial of topiramate, he would like to hold off on this.  Recommend trial of magnesium supplement as this may be helpful for migraine.    No orders of the defined types were placed in this encounter.   No follow-ups on file.    This visit occurred during the SARS-CoV-2 public health emergency.  Safety protocols were in place, including screening questions prior to the visit, additional usage of staff PPE, and extensive cleaning of exam room while observing appropriate contact time as indicated for disinfecting solutions.

## 2022-07-06 NOTE — Patient Instructions (Addendum)
Try adding magnesium supplement 450-600mg  daily.  Topiramate may be helpful with migraine prevention.  You should be contacted for appt. With Ophthalmology.

## 2022-07-07 LAB — CBC WITH DIFFERENTIAL/PLATELET
Eosinophils Absolute: 122 cells/uL (ref 15–500)
Eosinophils Relative: 2.7 %
HCT: 43.8 % (ref 38.5–50.0)
Hemoglobin: 15.1 g/dL (ref 13.2–17.1)
Lymphs Abs: 1332 cells/uL (ref 850–3900)
MCH: 30.8 pg (ref 27.0–33.0)
MCV: 89.4 fL (ref 80.0–100.0)
MPV: 9.5 fL (ref 7.5–12.5)
Neutro Abs: 2637 cells/uL (ref 1500–7800)
Total Lymphocyte: 29.6 %

## 2022-07-07 LAB — LIPID PANEL W/REFLEX DIRECT LDL
Cholesterol: 229 mg/dL — ABNORMAL HIGH (ref <200)
HDL: 49 mg/dL (ref >=40)
LDL Cholesterol (Calc): 161 mg/dL (calc) — ABNORMAL HIGH
Non-HDL Cholesterol (Calc): 180 mg/dL (calc) — ABNORMAL HIGH (ref <130)
Total CHOL/HDL Ratio: 4.7 (calc) (ref <5.0)
Triglycerides: 89 mg/dL (ref <150)

## 2022-07-07 LAB — COMPLETE METABOLIC PANEL WITH GFR
AG Ratio: 1.8 (calc) (ref 1.0–2.5)
ALT: 41 U/L (ref 9–46)
AST: 29 U/L (ref 10–40)
Albumin: 4.8 g/dL (ref 3.6–5.1)
Alkaline phosphatase (APISO): 54 U/L (ref 36–130)
BUN: 18 mg/dL (ref 7–25)
CO2: 29 mmol/L (ref 20–32)
Calcium: 9.8 mg/dL (ref 8.6–10.3)
Chloride: 105 mmol/L (ref 98–110)
Creat: 1.1 mg/dL (ref 0.60–1.29)
Globulin: 2.6 g/dL (calc) (ref 1.9–3.7)
Glucose, Bld: 100 mg/dL — ABNORMAL HIGH (ref 65–99)
Potassium: 4.5 mmol/L (ref 3.5–5.3)
Sodium: 142 mmol/L (ref 135–146)
Total Bilirubin: 0.6 mg/dL (ref 0.2–1.2)
Total Protein: 7.4 g/dL (ref 6.1–8.1)
eGFR: 83 mL/min/{1.73_m2} (ref >=60)

## 2022-07-07 LAB — TSH: TSH: 2.22 mIU/L (ref 0.40–4.50)

## 2022-07-25 ENCOUNTER — Encounter: Payer: Self-pay | Admitting: Family Medicine

## 2022-07-25 ENCOUNTER — Ambulatory Visit (INDEPENDENT_AMBULATORY_CARE_PROVIDER_SITE_OTHER): Payer: Managed Care, Other (non HMO) | Admitting: Family Medicine

## 2022-07-25 VITALS — BP 121/81 | HR 72 | Ht 72.0 in | Wt 211.0 lb

## 2022-07-25 DIAGNOSIS — Z1211 Encounter for screening for malignant neoplasm of colon: Secondary | ICD-10-CM | POA: Diagnosis not present

## 2022-07-25 DIAGNOSIS — Z0000 Encounter for general adult medical examination without abnormal findings: Secondary | ICD-10-CM

## 2022-07-25 DIAGNOSIS — G43109 Migraine with aura, not intractable, without status migrainosus: Secondary | ICD-10-CM

## 2022-07-25 NOTE — Progress Notes (Signed)
Brian Rasmussen - 47 y.o. male MRN [id number]  Date of birth: [birth year]  Subjective Chief Complaint  Patient presents with   Annual Exam    HPI Brian Rasmussen is a 47 y.o. male here today for for annual exam.   He has been dealing with ocular migraines.  He did see optometry but has not seen ophthalmology yet.    He is moderately active.  Plays tennis several times per week  Recent labs with elevated cholesterol.  Feels like diet is pretty good  He is a non-smoker.  Occasional EtOH use throughout the week.   Due for colon cancer screening.  He would like to do cologuard.  Review of Systems  Constitutional:  Negative for chills, fever, malaise/fatigue and weight loss.  HENT:  Negative for congestion, ear pain and sore throat.   Eyes:  Negative for blurred vision, double vision and pain.  Respiratory:  Negative for cough and shortness of breath.   Cardiovascular:  Negative for chest pain and palpitations.  Gastrointestinal:  Negative for abdominal pain, blood in stool, constipation, heartburn and nausea.  Genitourinary:  Negative for dysuria and urgency.  Musculoskeletal:  Negative for joint pain and myalgias.  Neurological:  Negative for dizziness and headaches.  Endo/Heme/Allergies:  Does not bruise/bleed easily.  Psychiatric/Behavioral:  Negative for depression. The patient is not nervous/anxious and does not have insomnia.     No Known Allergies  Past Medical History:  Diagnosis Date   Chronic headaches    Visual disturbances     Past Surgical History:  Procedure Laterality Date   TONSILLECTOMY      Social History   Socioeconomic History   Marital status: Significant Other    Spouse name: Not on file   Number of children: 1   Years of education: 15   Highest education level: Not on file  Occupational History   Occupation: Chartered certified accountant  Tobacco Use   Smoking status: Never   Smokeless tobacco: Never  Vaping Use   Vaping Use: Never used   Substance and Sexual Activity   Alcohol use: Yes    Alcohol/week: 3.0 - 4.0 standard drinks of alcohol    Types: 3 - 4 Standard drinks or equivalent per week    Comment: few drinks per week   Drug use: No   Sexual activity: Yes    Partners: Male    Birth control/protection: Post-menopausal  Other Topics Concern   Not on file  Social History Narrative   Right handed   Caffeine use: 28 oz coffee per day, coke sometimes    Social Determinants of Health   Financial Resource Strain: Not on file  Food Insecurity: Not on file  Transportation Needs: Not on file  Physical Activity: Not on file  Stress: Not on file  Social Connections: Not on file    Family History  Problem Relation Age of Onset   Hyperlipidemia Other    Hypertension Other    Heart attack Father    Cancer Paternal Grandmother     Health Maintenance  Topic Date Due   COLONOSCOPY (Pts 45-49yr Insurance coverage will need to be confirmed)  07/07/2023 (Originally 02/06/2019)   Hepatitis C Screening  07/07/2023 (Originally 02/06/1992)   COVID-19 Vaccine (3 - 2023-24 season) 07/23/2023 (Originally 01/28/2022)   INFLUENZA VACCINE  08/28/2023 (Originally 12/28/2021)   DTaP/Tdap/Td (2 - Td or Tdap) 11/06/2023   HPV VACCINES  Aged Out    ----------------------------------------------------------------------------------------------------------------------------------------------------------------------------------------------------------------- Physical Exam BP 121/81 (BP Location: Left Arm, Patient Position: Sitting,  Cuff Size: Normal)   Pulse 72   Ht 6' (1.829 m)   Wt 211 lb (95.7 kg)   SpO2 98%   BMI 28.62 kg/m   Physical Exam Constitutional:      General: He is not in acute distress. HENT:     Head: Normocephalic and atraumatic.     Right Ear: Tympanic membrane and external ear normal.     Left Ear: Tympanic membrane and external ear normal.  Eyes:     General: No scleral icterus. Neck:     Thyroid: No  thyromegaly.  Cardiovascular:     Rate and Rhythm: Normal rate and regular rhythm.     Heart sounds: Normal heart sounds.  Pulmonary:     Effort: Pulmonary effort is normal.     Breath sounds: Normal breath sounds.  Abdominal:     General: Bowel sounds are normal. There is no distension.     Palpations: Abdomen is soft.     Tenderness: There is no abdominal tenderness. There is no guarding.  Musculoskeletal:     Cervical back: Normal range of motion.  Lymphadenopathy:     Cervical: No cervical adenopathy.  Skin:    General: Skin is warm and dry.     Findings: No rash.  Neurological:     Mental Status: He is alert and oriented to person, place, and time.     Cranial Nerves: No cranial nerve deficit.     Motor: No abnormal muscle tone.  Psychiatric:        Mood and Affect: Mood normal.        Behavior: Behavior normal.     ------------------------------------------------------------------------------------------------------------------------------------------------------------------------------------------------------------------- Assessment and Plan  Well adult exam Well adult Orders Placed This Encounter  Procedures   Cologuard  Recent labs reviewed with him.  Screenings: Cologuard ordered.   Immunizations:  UTD Anticipatory guidance/Risk factor reduction:  Recommendations per AVS.   Ocular migraine Still has not heard from ophthalmology. Will follow up on this.    No orders of the defined types were placed in this encounter.   No follow-ups on file.    This visit occurred during the SARS-CoV-2 public health emergency.  Safety protocols were in place, including screening questions prior to the visit, additional usage of staff PPE, and extensive cleaning of exam room while observing appropriate contact time as indicated for disinfecting solutions.

## 2022-07-25 NOTE — Patient Instructions (Signed)

## 2022-07-25 NOTE — Assessment & Plan Note (Signed)
Still has not heard from ophthalmology. Will follow up on this.

## 2022-07-25 NOTE — Assessment & Plan Note (Signed)
Well adult Orders Placed This Encounter  Procedures   Cologuard  Recent labs reviewed with him.  Screenings: Cologuard ordered.   Immunizations:  UTD Anticipatory guidance/Risk factor reduction:  Recommendations per AVS.

## 2022-12-13 IMAGING — MR MR HEAD WO/W CM
12 series · 48 of 48 positions shown · IV contrast (gadavist)
Comparison: None.

CLINICAL DATA: Ocular migraines. Dizziness, headache, chronic; new
features or increased frequency.

EXAM:
MRI HEAD WITHOUT AND WITH CONTRAST
TECHNIQUE: Multiplanar, multiecho pulse sequences of the brain and surrounding
structures were obtained without and with intravenous contrast.
CONTRAST:  9mL GADAVIST GADOBUTROL 1 MMOL/ML IV SOLN

[Series 2: DWI · axial · 3.0mm · 1.20mm/px · z∈[-22,+140]mm · 8 of 110 slices shown (1 of 4)]
[im 1/110]
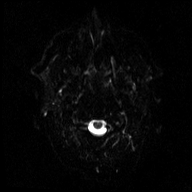
[im 16/110]
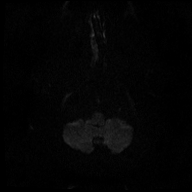
[im 32/110]
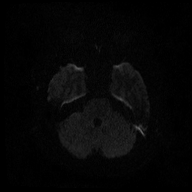
[im 47/110]
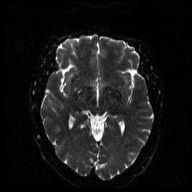
[im 63/110]
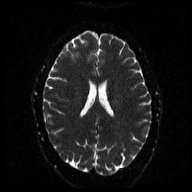
[im 78/110]
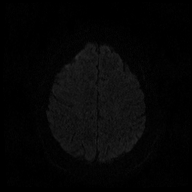
[im 94/110]
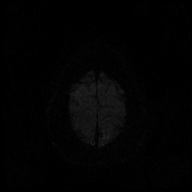
[im 110/110]
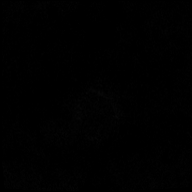

[Series 3: DWI · axial · 3.0mm · 1.20mm/px · z∈[-22,+140]mm · 3 of 55 slices shown (2 of 4)]
[im 1/55]
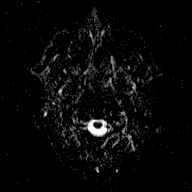
[im 28/55]
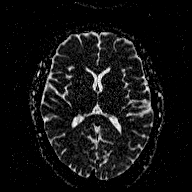
[im 55/55]
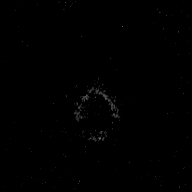

[Series 4: DWI · coronal · 3.0mm · 1.15mm/px · 6 of 93 slices shown (3 of 4)]
[im 1/93]
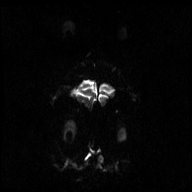
[im 19/93]
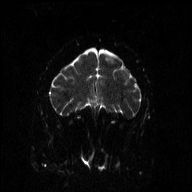
[im 37/93]
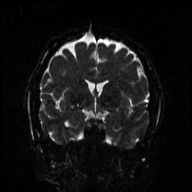
[im 56/93]
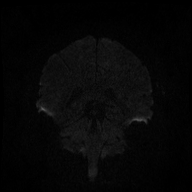
[im 74/93]
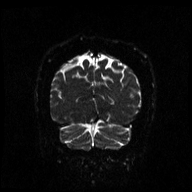
[im 93/93]
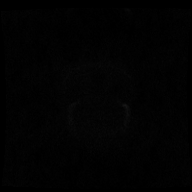

[Series 5: DWI · coronal · 3.0mm · 1.15mm/px · 3 of 47 slices shown (4 of 4)]
[im 1/47]
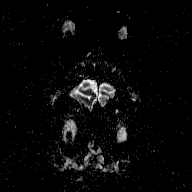
[im 24/47]
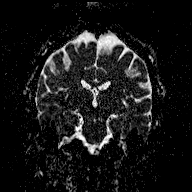
[im 47/47]
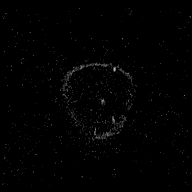

[Series 6: T1 · sagittal · 5.0mm · 0.45mm/px · 1 of 23 slices shown (1 of 2)]
[im 1/23]
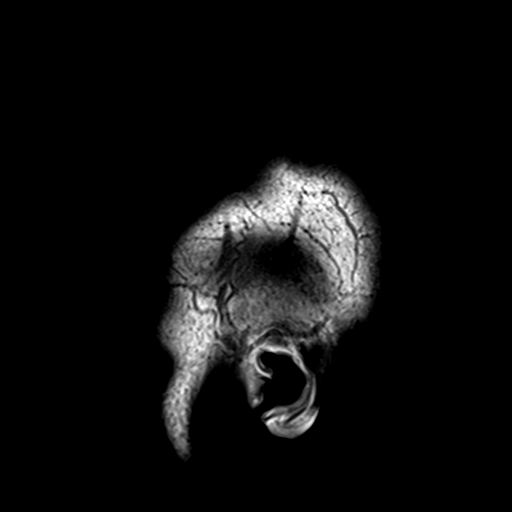

[Series 7: T2 · axial · 5.0mm · 0.72mm/px · 1 of 23 slices shown (1 of 2)]
[im 1/23]
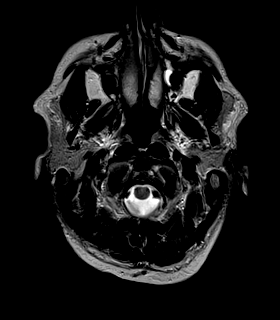

[Series 8: FLAIR · axial · 3.0mm · 0.45mm/px · z∈[-21,+140]mm · 3 of 55 slices shown]
[im 1/55]
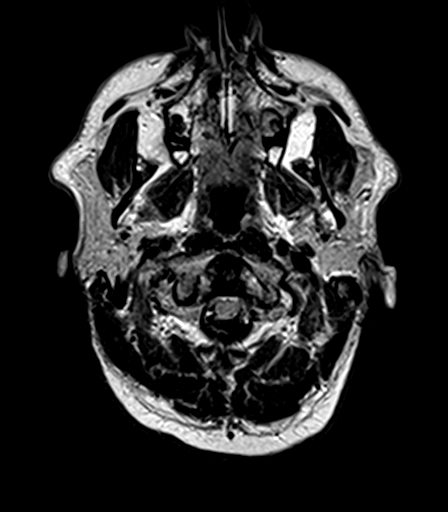
[im 28/55]
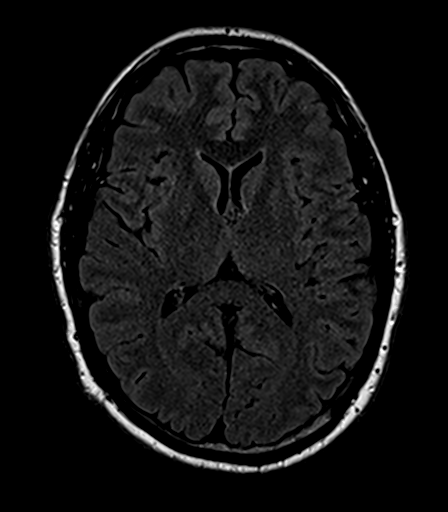
[im 55/55]
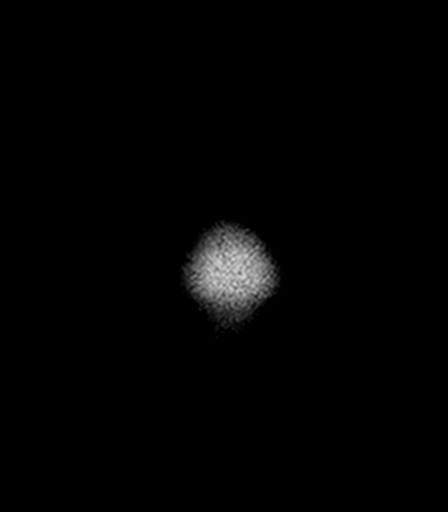

[Series 9: T2 · axial · 5.0mm · 0.72mm/px · 1 of 23 slices shown (2 of 2)]
[im 1/23]
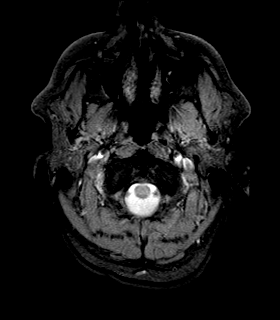

[Series 10: T1 · axial · 1.0mm · 1.00mm/px · z∈[-19,+139]mm · 9 of 160 slices shown (2 of 2)]
[im 1/160]
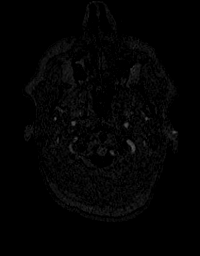
[im 20/160]
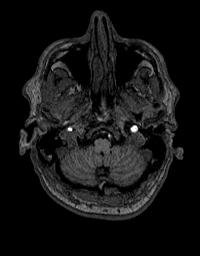
[im 40/160]
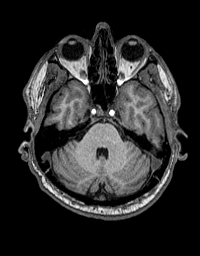
[im 60/160]
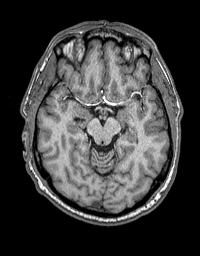
[im 80/160]
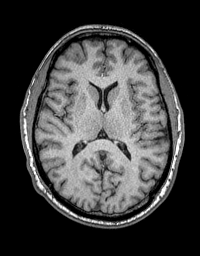
[im 100/160]
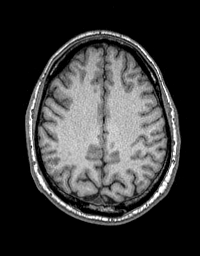
[im 120/160]
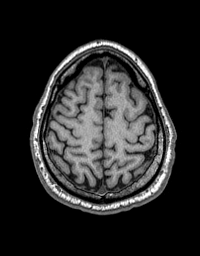
[im 140/160]
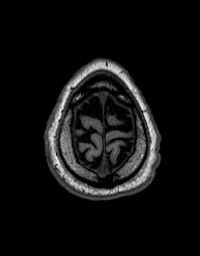
[im 160/160]
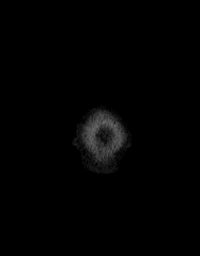

[Series 11: T2 post-contrast · coronal · 5.0mm · 0.43mm/px · 2 of 31 slices shown]
[im 1/31]
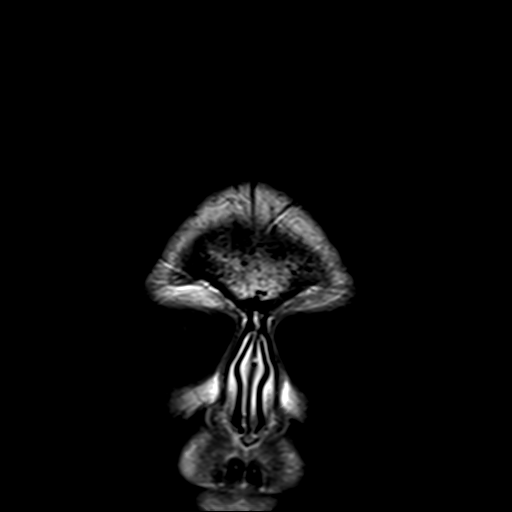
[im 31/31]
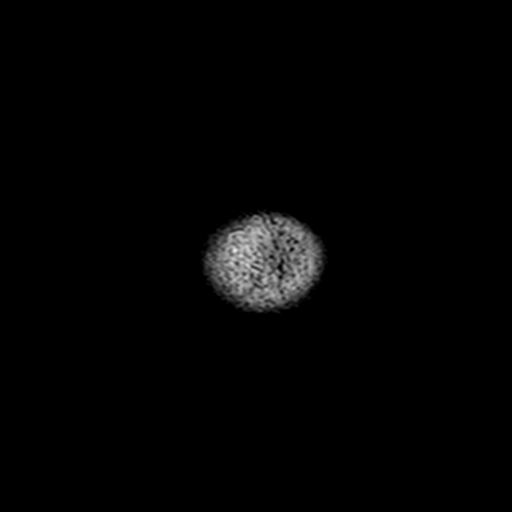

[Series 12: T1 post-contrast · axial · 1.0mm · 1.00mm/px · z∈[-19,+139]mm · 9 of 160 slices shown (1 of 2)]
[im 1/160]
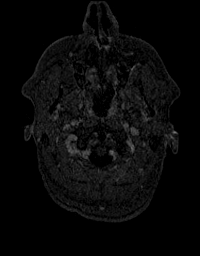
[im 20/160]
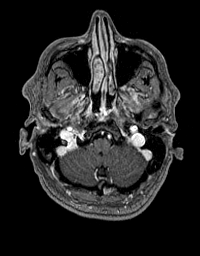
[im 40/160]
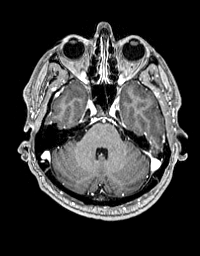
[im 60/160]
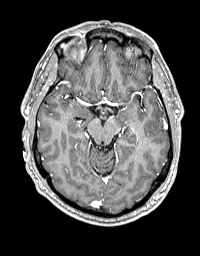
[im 80/160]
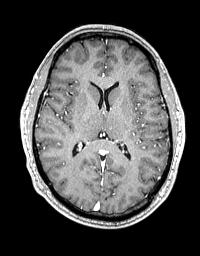
[im 100/160]
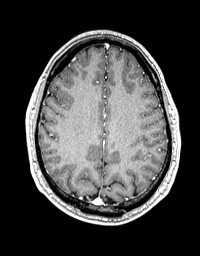
[im 120/160]
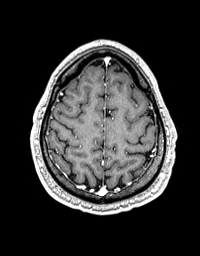
[im 140/160]
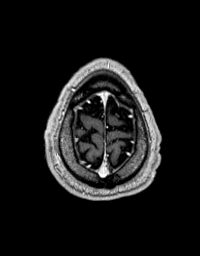
[im 160/160]
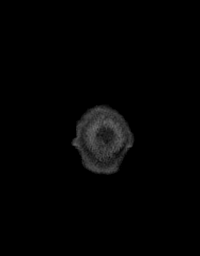

[Series 13: T1 post-contrast · coronal · 5.0mm · 0.43mm/px · 2 of 31 slices shown (2 of 2)]
[im 1/31]
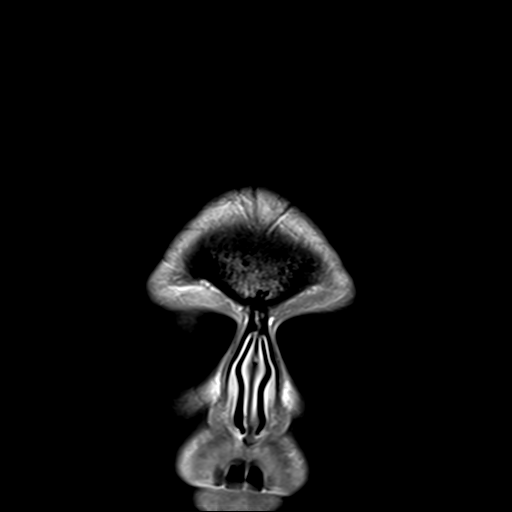
[im 31/31]
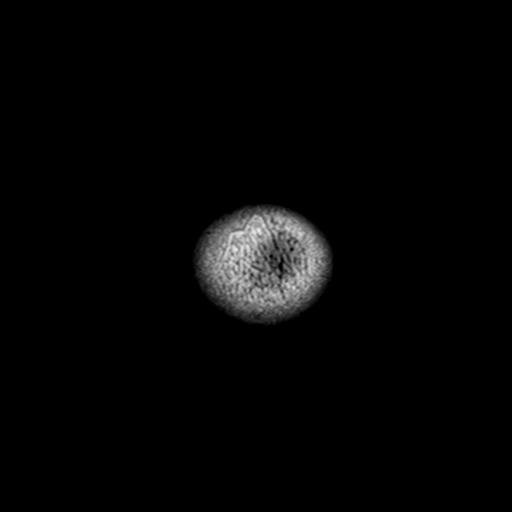

[48 of 48 positions shown; findings below may reference images not displayed]

FINDINGS: Brain: No acute infarction, hemorrhage, hydrocephalus, extra-axial
collection or mass lesion. The brain parenchyma has normal
morphology and signal characteristics. No focus of abnormal contrast
enhancement.

Vascular: Normal flow voids.

Skull and upper cervical spine: Normal marrow signal.

Sinuses/Orbits: Mild mucosal thickening in the left maxillary sinus.
The orbits are maintained.

Other: None.
IMPRESSION: Unremarkable MRI of the brain.

## 2023-04-20 ENCOUNTER — Ambulatory Visit: Payer: Managed Care, Other (non HMO)

## 2023-04-20 ENCOUNTER — Encounter: Payer: Self-pay | Admitting: Sports Medicine

## 2023-04-20 ENCOUNTER — Ambulatory Visit: Payer: Managed Care, Other (non HMO) | Admitting: Sports Medicine

## 2023-04-20 DIAGNOSIS — M549 Dorsalgia, unspecified: Secondary | ICD-10-CM

## 2023-04-20 DIAGNOSIS — M51362 Other intervertebral disc degeneration, lumbar region with discogenic back pain and lower extremity pain: Secondary | ICD-10-CM

## 2023-04-20 DIAGNOSIS — M51369 Other intervertebral disc degeneration, lumbar region without mention of lumbar back pain or lower extremity pain: Secondary | ICD-10-CM | POA: Insufficient documentation

## 2023-04-20 MED ORDER — PREDNISONE 50 MG PO TABS: ORAL_TABLET | ORAL | 0 refills | Status: AC

## 2023-04-20 MED ORDER — IBUPROFEN 800 MG PO TABS: 800.0000 mg | ORAL_TABLET | Freq: Three times a day (TID) | ORAL | 2 refills | Status: AC | PRN

## 2023-04-20 NOTE — Assessment & Plan Note (Signed)
Pleasant 47 year old male, he has had about 3 weeks of pain axial low back into the right buttock with occasional radiation down the leg. Worse with flexion, Valsalva. No bowel or bladder dysfunction, saddle numbness or constitutional symptoms. No obvious injuries. He does have a positive straight leg raise on the right. We explained the anatomy and evolutionary anthropology of lumbar disc disease, he understands the importance of controlling his pain with steroids now followed by occasional anti-inflammatories and most important of all, aggressive core conditioning and physical therapy, we will do all of the above for 6 weeks and proceed with MRI for interventional planning if not better.

## 2023-04-20 NOTE — Progress Notes (Signed)
    Procedures performed today:    None.  Independent interpretation of notes and tests performed by another provider:   None.  Brief History, Exam, Impression, and Recommendations:    Lumbar degenerative disc disease Pleasant 47 year old male, he has had about 3 weeks of pain axial low back into the right buttock with occasional radiation down the leg. Worse with flexion, Valsalva. No bowel or bladder dysfunction, saddle numbness or constitutional symptoms. No obvious injuries. He does have a positive straight leg raise on the right. We explained the anatomy and evolutionary anthropology of lumbar disc disease, he understands the importance of controlling his pain with steroids now followed by occasional anti-inflammatories and most important of all, aggressive core conditioning and physical therapy, we will do all of the above for 6 weeks and proceed with MRI for interventional planning if not better.    ____________________________________________ Ihor Austin. Benjamin Stain, M.D., ABFM., CAQSM., AME. Primary Care and Sports Medicine [facility]  Adjunct Professor of Family Medicine  [employer]  Restaurant manager, fast food

## 2023-04-24 ENCOUNTER — Encounter: Payer: Self-pay | Admitting: Sports Medicine

## 2023-06-01 ENCOUNTER — Ambulatory Visit: Payer: Managed Care, Other (non HMO) | Admitting: Sports Medicine

## 2024-01-30 ENCOUNTER — Encounter: Payer: Self-pay | Admitting: Sports Medicine

## 2024-03-14 ENCOUNTER — Telehealth: Payer: Self-pay

## 2024-03-14 NOTE — Telephone Encounter (Signed)
 Left message for a return call about colon cancer screening. No documentation of colonoscopy or Cologuard.
